# Patient Record
Sex: Female | Born: 1965 | Race: White | Hispanic: No | Smoking: Never smoker
Health system: Southern US, Community
[De-identification: ages and names within clinical notes are randomized; demographics above are authoritative.]

## PROBLEM LIST (undated history)

## (undated) DIAGNOSIS — I10 Essential (primary) hypertension: Secondary | ICD-10-CM

## (undated) DIAGNOSIS — G510 Bell's palsy: Secondary | ICD-10-CM

## (undated) HISTORY — DX: Bell's palsy: G51.0

## (undated) HISTORY — DX: Essential (primary) hypertension: I10

## (undated) HISTORY — PX: COSMETIC SURGERY: SHX468

## (undated) HISTORY — PX: NOSE SURGERY: SHX723

---

## 2013-12-11 ENCOUNTER — Emergency Department (HOSPITAL_COMMUNITY)
Admission: EM | Admit: 2013-12-11 | Discharge: 2013-12-11 | Disposition: A | Discharge status: Home / Self Care | Payer: 59 | Attending: Emergency Medicine | Admitting: Emergency Medicine

## 2013-12-11 ENCOUNTER — Emergency Department (HOSPITAL_COMMUNITY): Payer: 59

## 2013-12-11 ENCOUNTER — Encounter (HOSPITAL_COMMUNITY): Payer: Self-pay | Admitting: Emergency Medicine

## 2013-12-11 DIAGNOSIS — Y9389 Activity, other specified: Secondary | ICD-10-CM | POA: Diagnosis not present

## 2013-12-11 DIAGNOSIS — S92009A Unspecified fracture of unspecified calcaneus, initial encounter for closed fracture: Secondary | ICD-10-CM | POA: Diagnosis not present

## 2013-12-11 DIAGNOSIS — Y9289 Other specified places as the place of occurrence of the external cause: Secondary | ICD-10-CM | POA: Insufficient documentation

## 2013-12-11 DIAGNOSIS — S99929A Unspecified injury of unspecified foot, initial encounter: Secondary | ICD-10-CM

## 2013-12-11 DIAGNOSIS — S92001A Unspecified fracture of right calcaneus, initial encounter for closed fracture: Secondary | ICD-10-CM

## 2013-12-11 DIAGNOSIS — S8990XA Unspecified injury of unspecified lower leg, initial encounter: Secondary | ICD-10-CM | POA: Diagnosis present

## 2013-12-11 DIAGNOSIS — S99919A Unspecified injury of unspecified ankle, initial encounter: Secondary | ICD-10-CM

## 2013-12-11 DIAGNOSIS — W010XXA Fall on same level from slipping, tripping and stumbling without subsequent striking against object, initial encounter: Secondary | ICD-10-CM | POA: Diagnosis not present

## 2013-12-11 NOTE — ED Notes (Signed)
Pt tripped over her 40 week old puppy and was trying not to land on the dog and fell. Pt c/o pain and swelling to right ankle.

## 2013-12-11 NOTE — ED Provider Notes (Signed)
CSN: [id number]     Arrival date & time 12/11/13  1227 History  This chart was scribed for non-physician practitioner Alvina Chou, PA-C, working with Tanna Furry, MD by Zola Button, ED Scribe. This patient was seen in room [place] and the patient's care was started at 2:05 PM.    Chief Complaint  Patient presents with  . Fall  . Ankle Pain      The history is provided by the patient. No language interpreter was used.   HPI Comments: Lydia Lopez is a 48 y.o. female who presents to the Emergency Department complaining of sudden onset, constant right ankle pain with swelling following a fall that occurred PTA. Patient says that she tripped over her puppy while trying not to land on it. She says she is able to ambulate slowly. She denies any other injuries. Patient drove herself to the ED.   History reviewed. No pertinent past medical history. History reviewed. No pertinent past surgical history. No family history on file. History  Substance Use Topics  . Smoking status: Never Smoker   . Smokeless tobacco: Not on file  . Alcohol Use: Yes     Comment: social   OB History   Grav Para Term Preterm Abortions TAB SAB Ect Mult Living                 Review of Systems  Musculoskeletal: Positive for arthralgias (right ankle) and joint swelling.  All other systems reviewed and are negative.     Allergies  Review of patient's allergies indicates not on file.  Home Medications   Prior to Admission medications   Not on File   BP 212/114  Pulse 105  Temp(Src) 98.8 F (37.1 C) (Oral)  Resp 20  SpO2 99%  LMP 12/07/2013 Physical Exam  Nursing note and vitals reviewed. Constitutional: She is oriented to person, place, and time. She appears well-developed and well-nourished. No distress.  HENT:  Head: Normocephalic and atraumatic.  Mouth/Throat: Oropharynx is clear and moist. No oropharyngeal exudate.  Eyes: Pupils are equal, round, and reactive to light.  Neck: Neck  supple.  Cardiovascular: Normal rate.   Pulmonary/Chest: Effort normal.  Musculoskeletal: She exhibits no edema.  Right lateral foot TTP over the base of the 5th metatarsal, right lateral foot swelling, limited right ankle ROM due to pain, patient can wiggle toes  Neurological: She is alert and oriented to person, place, and time. No cranial nerve deficit.  Sensation intact for toes of right foot  Skin: Skin is warm and dry. No rash noted.  Psychiatric: She has a normal mood and affect. Her behavior is normal.    ED Course  Procedures  DIAGNOSTIC STUDIES: Oxygen Saturation is 99% on RA, nml by my interpretation.    COORDINATION OF CARE: 2:10 PM-Discussed treatment plan which includes pain medication, crutches, a splint and recommendation of ice and elevation for pain with pt at bedside and pt agreed to plan.   Labs Review Labs Reviewed - No data to display  Imaging Review Dg Ankle Complete Right  12/11/2013   CLINICAL DATA:  Injury.  Right ankle pain.  EXAM: RIGHT ANKLE - COMPLETE 3+ VIEW  COMPARISON:  None.  FINDINGS: A bony fragment consistent with an avulsion fracture is seen laterally likely emanating from the distal calcaneus. No other acute bony or joint abnormality is identified.  IMPRESSION: Findings most consistent with an avulsion fracture at the origin of the extensor digitorum brevis.   Electronically Signed   By: Marcello Moores  Dalessio M.D.   On: 12/11/2013 13:33   Dg Foot Complete Right  12/11/2013   CLINICAL DATA:  Tripped and fell with ankle pain  EXAM: RIGHT FOOT COMPLETE - 3+ VIEW  COMPARISON:  Ankle film from earlier in the same day  FINDINGS: There is again noted in avulsion fracture from the distal lateral aspect of the calcaneus. No other fractures are seen. No gross soft tissue abnormality is noted.  IMPRESSION: Avulsion fracture as described.   Electronically Signed   By: Inez Catalina M.D.   On: 12/11/2013 81:82    SPLINT APPLICATION Date/Time: 9:93 PM Authorized by:  Alvina Chou Consent: Verbal consent obtained. Risks and benefits: risks, benefits and alternatives were discussed Consent given by: patient Splint applied by: orthopedic technician Location details: right ankle Splint type: CAM walker Supplies used: CAM walker Post-procedure: The splinted body part was neurovascularly unchanged following the procedure. Patient tolerance: Patient tolerated the procedure well with no immediate complications.      EKG Interpretation None      MDM   Final diagnoses:  Avulsion fracture of calcaneus, right, closed, initial encounter    2:31 PM Patient's xray shows avulsion fracture of right calcaneus. Patient will have CAM walker, crutches and Orthopedic follow up. No neurovascular compromise. No other injury.   I personally performed the services described in this documentation, which was scribed in my presence. The recorded information has been reviewed and is accurate.    Alvina Chou, PA-C 12/11/13 1432

## 2013-12-11 NOTE — Discharge Instructions (Signed)
Take ibuprofen or tylenol as needed for pain. Follow up with Dr. Mayer Camel for further evaluation. Rest, ice and elevate your foot for symptom relief.

## 2013-12-13 NOTE — ED Provider Notes (Signed)
Medical screening examination/treatment/procedure(s) were performed by non-physician practitioner and as supervising physician I was immediately available for consultation/collaboration.   EKG Interpretation None        Tanna Furry, MD 12/13/13 0830

## 2015-06-22 DIAGNOSIS — E559 Vitamin D deficiency, unspecified: Secondary | ICD-10-CM | POA: Diagnosis not present

## 2015-06-22 DIAGNOSIS — R5383 Other fatigue: Secondary | ICD-10-CM | POA: Diagnosis not present

## 2015-06-22 DIAGNOSIS — R0602 Shortness of breath: Secondary | ICD-10-CM | POA: Diagnosis not present

## 2015-06-22 DIAGNOSIS — Z0000 Encounter for general adult medical examination without abnormal findings: Secondary | ICD-10-CM | POA: Diagnosis not present

## 2015-06-22 DIAGNOSIS — Z1389 Encounter for screening for other disorder: Secondary | ICD-10-CM | POA: Diagnosis not present

## 2015-07-06 DIAGNOSIS — E559 Vitamin D deficiency, unspecified: Secondary | ICD-10-CM | POA: Diagnosis not present

## 2015-07-06 DIAGNOSIS — E7800 Pure hypercholesterolemia, unspecified: Secondary | ICD-10-CM | POA: Diagnosis not present

## 2015-07-15 DIAGNOSIS — N183 Chronic kidney disease, stage 3 (moderate): Secondary | ICD-10-CM | POA: Diagnosis not present

## 2015-08-19 ENCOUNTER — Ambulatory Visit: Payer: 59 | Admitting: Family Medicine

## 2015-08-22 IMAGING — CR DG FOOT COMPLETE 3+V*R*
3 series · 3 of 3 positions shown · non-contrast
Comparison: Ankle film from earlier in the same day

CLINICAL DATA: Tripped and fell with ankle pain

EXAM:
RIGHT FOOT COMPLETE - 3+ VIEW

[x foot lat right]
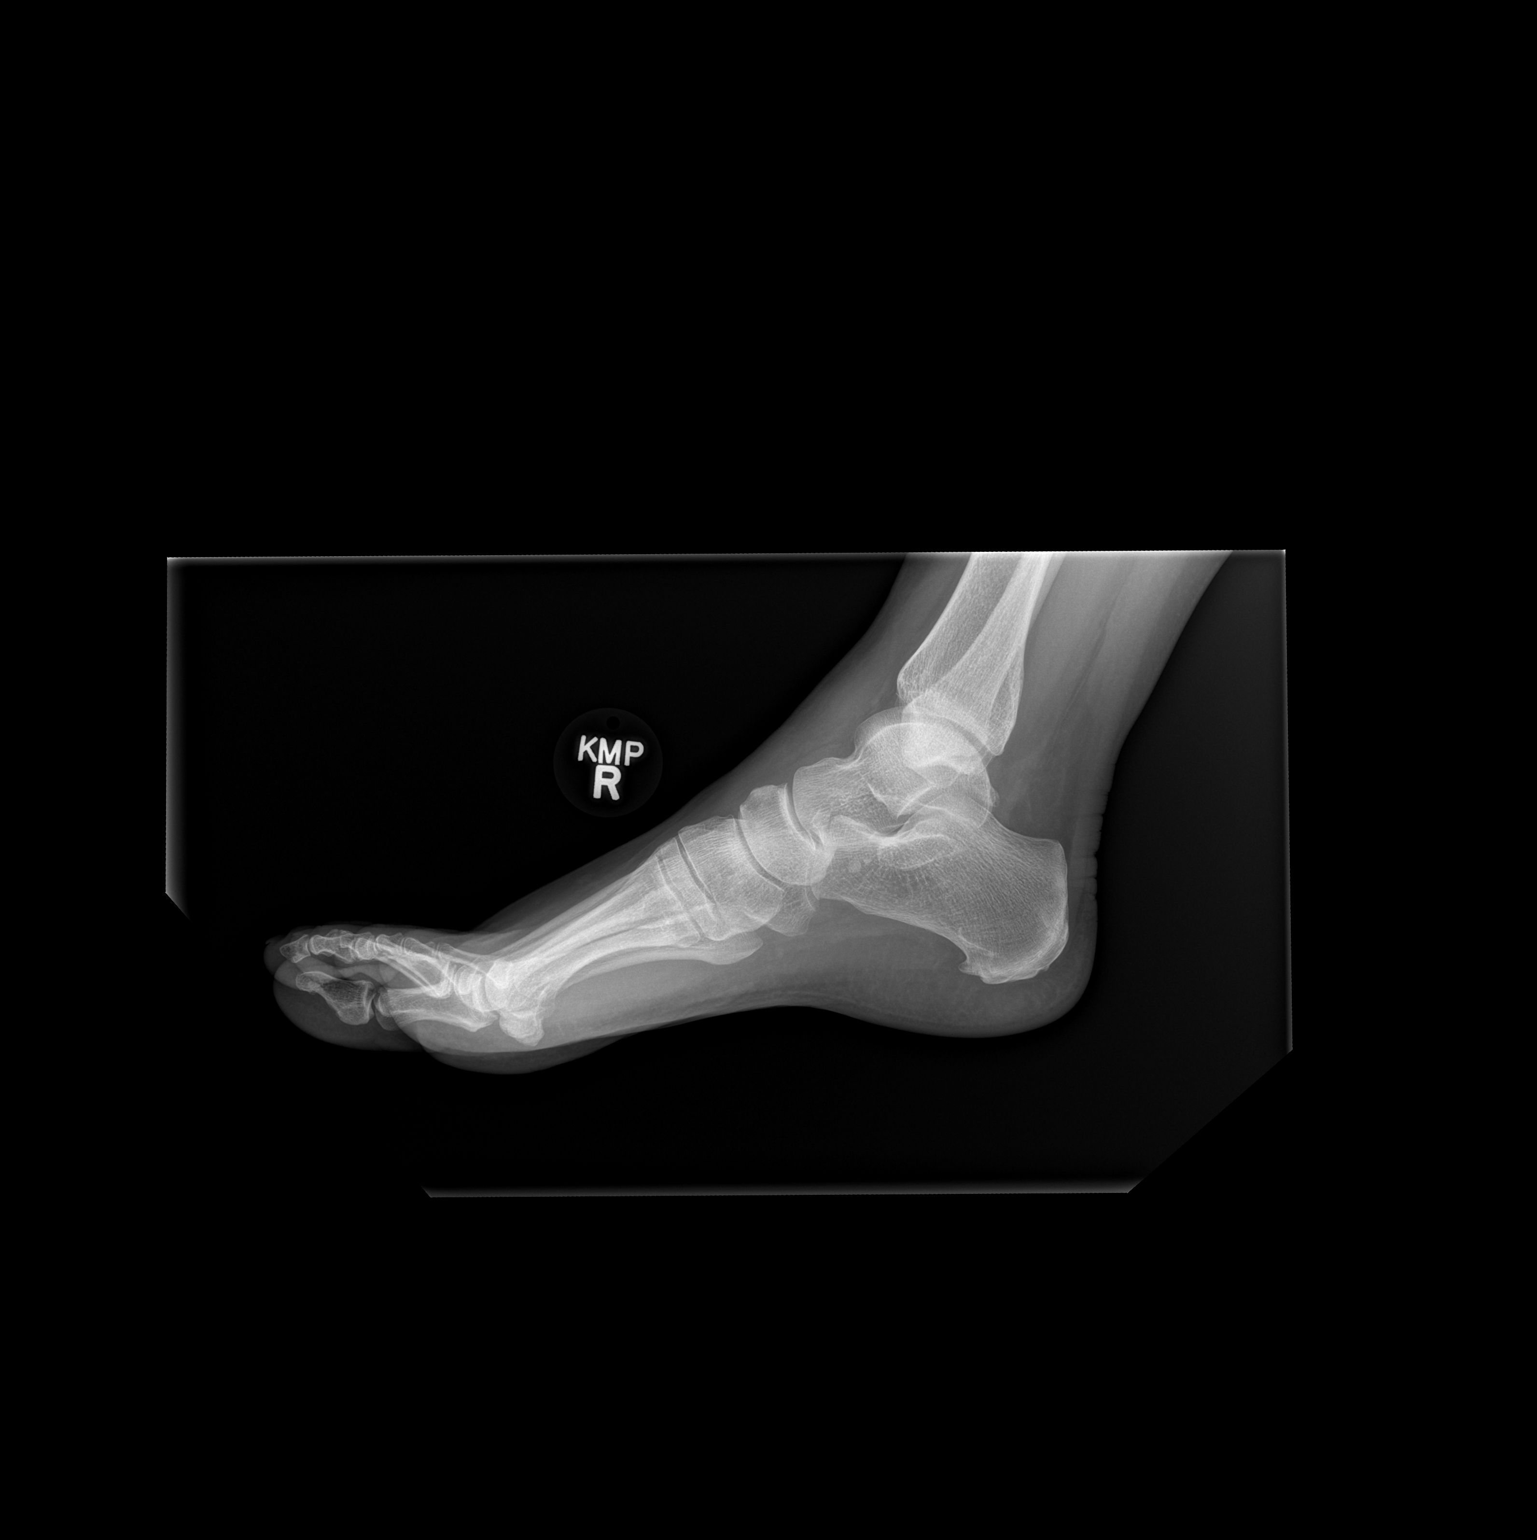

[x foot ap right]
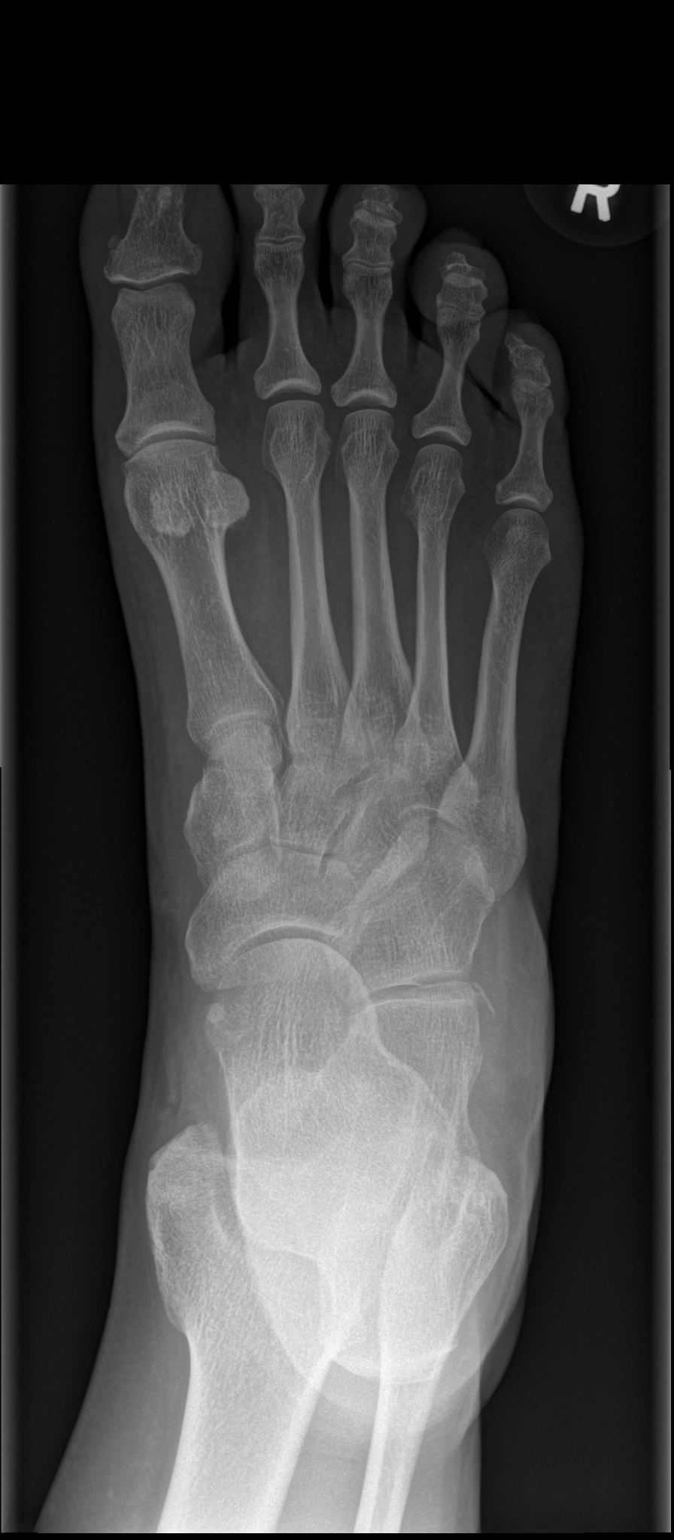

[x foot obl right]
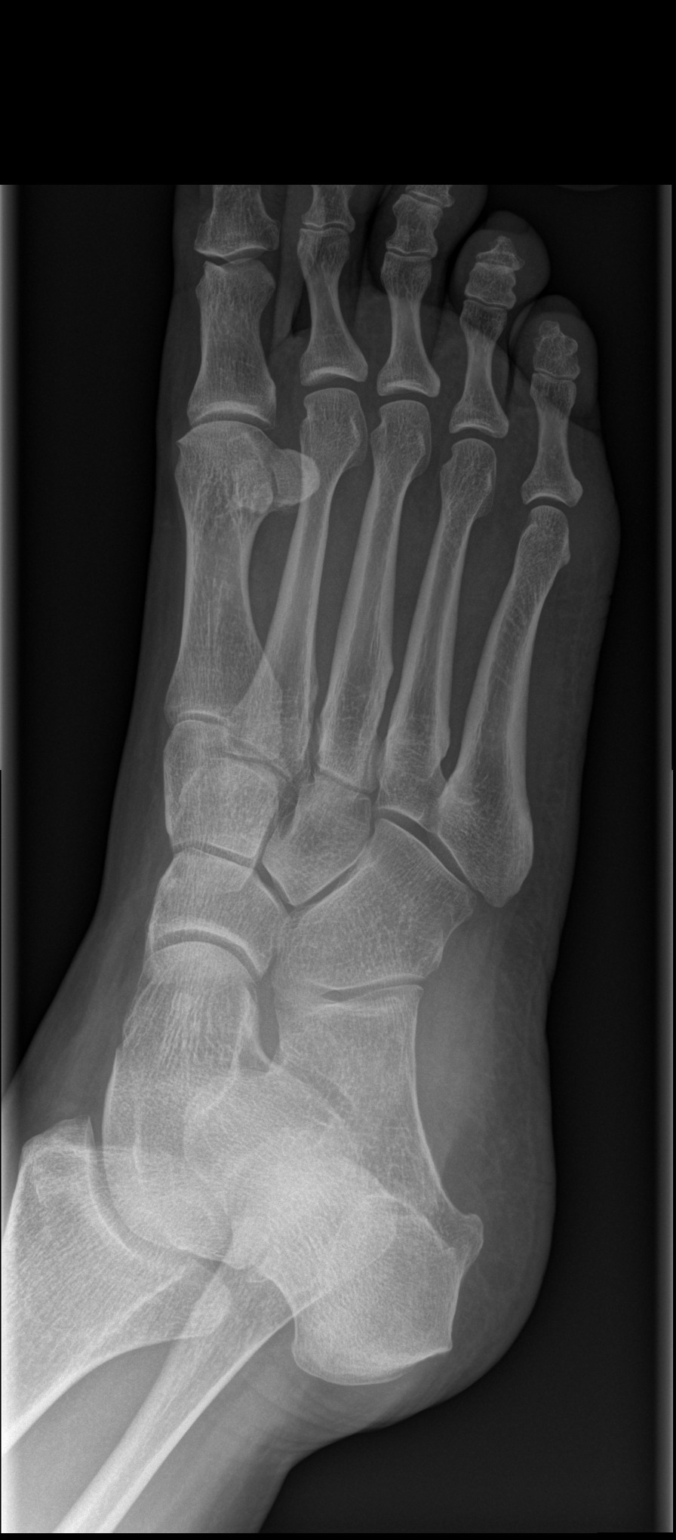

[3 of 3 positions shown; findings below may reference images not displayed]

FINDINGS: There is again noted in avulsion fracture from the distal lateral
aspect of the calcaneus. No other fractures are seen. No gross soft
tissue abnormality is noted.
IMPRESSION: Avulsion fracture as described.

## 2016-05-10 DIAGNOSIS — R0602 Shortness of breath: Secondary | ICD-10-CM | POA: Diagnosis not present

## 2016-05-10 DIAGNOSIS — J069 Acute upper respiratory infection, unspecified: Secondary | ICD-10-CM | POA: Diagnosis not present

## 2016-05-10 DIAGNOSIS — J209 Acute bronchitis, unspecified: Secondary | ICD-10-CM | POA: Diagnosis not present

## 2016-05-10 DIAGNOSIS — E559 Vitamin D deficiency, unspecified: Secondary | ICD-10-CM | POA: Diagnosis not present

## 2016-09-26 DIAGNOSIS — R5383 Other fatigue: Secondary | ICD-10-CM | POA: Diagnosis not present

## 2016-09-26 DIAGNOSIS — Z0000 Encounter for general adult medical examination without abnormal findings: Secondary | ICD-10-CM | POA: Diagnosis not present

## 2016-09-26 DIAGNOSIS — E559 Vitamin D deficiency, unspecified: Secondary | ICD-10-CM | POA: Diagnosis not present

## 2016-09-26 DIAGNOSIS — Z114 Encounter for screening for human immunodeficiency virus [HIV]: Secondary | ICD-10-CM | POA: Diagnosis not present

## 2018-03-24 DIAGNOSIS — L219 Seborrheic dermatitis, unspecified: Secondary | ICD-10-CM | POA: Diagnosis not present

## 2018-03-24 DIAGNOSIS — R05 Cough: Secondary | ICD-10-CM | POA: Diagnosis not present

## 2018-03-24 DIAGNOSIS — J209 Acute bronchitis, unspecified: Secondary | ICD-10-CM | POA: Diagnosis not present

## 2018-04-25 DIAGNOSIS — J209 Acute bronchitis, unspecified: Secondary | ICD-10-CM | POA: Diagnosis not present

## 2018-04-25 DIAGNOSIS — Z114 Encounter for screening for human immunodeficiency virus [HIV]: Secondary | ICD-10-CM | POA: Diagnosis not present

## 2018-04-25 DIAGNOSIS — Z0000 Encounter for general adult medical examination without abnormal findings: Secondary | ICD-10-CM | POA: Diagnosis not present

## 2018-04-25 DIAGNOSIS — R05 Cough: Secondary | ICD-10-CM | POA: Diagnosis not present

## 2018-09-24 DIAGNOSIS — N61 Mastitis without abscess: Secondary | ICD-10-CM | POA: Diagnosis not present

## 2018-10-08 DIAGNOSIS — R05 Cough: Secondary | ICD-10-CM | POA: Diagnosis not present

## 2018-10-08 DIAGNOSIS — N61 Mastitis without abscess: Secondary | ICD-10-CM | POA: Diagnosis not present

## 2018-10-08 DIAGNOSIS — J209 Acute bronchitis, unspecified: Secondary | ICD-10-CM | POA: Diagnosis not present

## 2018-10-08 DIAGNOSIS — Z79899 Other long term (current) drug therapy: Secondary | ICD-10-CM | POA: Diagnosis not present

## 2018-10-08 DIAGNOSIS — E7800 Pure hypercholesterolemia, unspecified: Secondary | ICD-10-CM | POA: Diagnosis not present

## 2018-10-08 DIAGNOSIS — Z1159 Encounter for screening for other viral diseases: Secondary | ICD-10-CM | POA: Diagnosis not present

## 2021-04-17 ENCOUNTER — Ambulatory Visit
Admission: EM | Admit: 2021-04-17 | Discharge: 2021-04-17 | Disposition: A | Discharge status: Home / Self Care | Payer: No Typology Code available for payment source | Attending: Internal Medicine | Admitting: Internal Medicine

## 2021-04-17 ENCOUNTER — Other Ambulatory Visit: Payer: Self-pay

## 2021-04-17 DIAGNOSIS — H65191 Other acute nonsuppurative otitis media, right ear: Secondary | ICD-10-CM

## 2021-04-17 DIAGNOSIS — J069 Acute upper respiratory infection, unspecified: Secondary | ICD-10-CM | POA: Diagnosis not present

## 2021-04-17 MED ORDER — DOXYCYCLINE HYCLATE 100 MG PO CAPS: 100.0000 mg | ORAL_CAPSULE | Freq: Two times a day (BID) | ORAL | 0 refills | Status: DC

## 2021-04-17 NOTE — Discharge Instructions (Signed)
You are being treated with antibiotics for ear infection and upper respiratory infection.

## 2021-04-17 NOTE — ED Provider Notes (Signed)
[facility]    CSN: [id number] Arrival date & time: 04/17/21  0804      History   Chief Complaint Chief Complaint  Patient presents with   Facial Pain    HPI Lydia Lopez is a 56 y.o. female.   Patient presents with sinus pressure, nasal congestion, nasal drainage, bilateral ear discomfort that started approximately 3 days ago.  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  She has never seen an ENT specialist.  Patient denies any cough, chest pain, shortness of breath, sore throat, nausea, vomiting, diarrhea, abdominal pain.  Denies any known fevers or sick contacts but does she does work at the hospital.  Patient has taken [place] with minimal improvement in symptoms.  Patient reports that she was recently treated for sinus infection at the beginning of January with cephalexin.    History reviewed. No pertinent past medical history.  There are no problems to display for this patient.   History reviewed. No pertinent surgical history.  OB History   No obstetric history on file.      Home Medications    Prior to Admission medications   Medication Sig Start Date End Date Taking? Authorizing Provider  doxycycline (VIBRAMYCIN) 100 MG capsule Take 1 capsule (100 mg total) by mouth 2 (two) times daily. 04/17/21  Yes Teodora Medici, FNP    Family History History reviewed. No pertinent family history.  Social History Social History   Tobacco Use   Smoking status: Never  Substance Use Topics   Alcohol use: Yes    Comment: social     Allergies   Penicillins   Review of Systems Review of Systems Per HPI  Physical Exam Triage Vital Signs ED Triage Vitals [04/17/21 0831]  Enc Vitals Group     BP (!) 158/100     Pulse Rate 94     Resp 18     Temp 97.7 F (36.5 C)     Temp Source Oral     SpO2 98 %     Weight      Height      Head Circumference      Peak Flow      Pain  Score 0     Pain Loc      Pain Edu?      Excl. in [place]?    No data found.  Updated Vital Signs BP (!) 158/100 (BP Location: Left Arm)    Pulse 94    Temp 97.7 F (36.5 C) (Oral)    Resp 18    SpO2 98%   Visual Acuity Right Eye Distance:   Left Eye Distance:   Bilateral Distance:    Right Eye Near:   Left Eye Near:    Bilateral Near:     Physical Exam Constitutional:      General: She is not in acute distress.    Appearance: Normal appearance. She is not toxic-appearing or diaphoretic.  HENT:     Head: Normocephalic and atraumatic.     Right Ear: Ear canal normal. No swelling or tenderness. No middle ear effusion. No mastoid tenderness. Tympanic membrane is erythematous. Tympanic membrane is not perforated or bulging.     Left Ear: Tympanic membrane and ear canal normal. No swelling or tenderness.  No middle ear effusion. No mastoid tenderness. Tympanic membrane is not perforated, erythematous or bulging.     Nose: Congestion present.  Mouth/Throat:     Mouth: Mucous membranes are moist.     Pharynx: No posterior oropharyngeal erythema.  Eyes:     Extraocular Movements: Extraocular movements intact.     Conjunctiva/sclera: Conjunctivae normal.     Pupils: Pupils are equal, round, and reactive to light.  Cardiovascular:     Rate and Rhythm: Normal rate and regular rhythm.     Pulses: Normal pulses.     Heart sounds: Normal heart sounds.  Pulmonary:     Effort: Pulmonary effort is normal. No respiratory distress.     Breath sounds: Normal breath sounds. No stridor. No wheezing, rhonchi or rales.  Abdominal:     General: Abdomen is flat. Bowel sounds are normal.     Palpations: Abdomen is soft.  Musculoskeletal:        General: Normal range of motion.     Cervical back: Normal range of motion.  Skin:    General: Skin is warm and dry.  Neurological:     General: No focal deficit present.     Mental Status: She is alert and oriented to person, place, and time. Mental  status is at baseline.  Psychiatric:        Mood and Affect: Mood normal.        Behavior: Behavior normal.     UC Treatments / Results  Labs (all labs ordered are listed, but only abnormal results are displayed) Labs Reviewed  NOVEL CORONAVIRUS, NAA    EKG   Radiology No results found.  Procedures Procedures (including critical care time)  Medications Ordered in UC Medications - No data to display  Initial Impression / Assessment and Plan / UC Course  I have reviewed the triage vital signs and the nursing notes.  Pertinent labs & imaging results that were available during my care of the patient were reviewed by me and considered in my medical decision making (see chart for details).     Patient's symptoms may be viral in etiology but she does have right otitis media.  It is possible that she could be developing a sinus affection given history of recurrent sinus infections.  Will opt to treat with doxycycline antibiotic given patient's current allergy list, patient recently being treated with cephalexin antibiotic, an ear infection that is present.  COVID-19 PCR pending.  Discussed symptomatic treatment with patient.  Patient verbalized understanding was agreeable with plan. Final Clinical Impressions(s) / UC Diagnoses   Final diagnoses:  Other non-recurrent acute nonsuppurative otitis media of right ear  Acute upper respiratory infection     Discharge Instructions      You are being treated with antibiotics for ear infection and upper respiratory infection.    ED Prescriptions     Medication Sig Dispense Auth. Provider   doxycycline (VIBRAMYCIN) 100 MG capsule Take 1 capsule (100 mg total) by mouth 2 (two) times daily. 20 capsule Teodora Medici, [place]      PDMP not reviewed this encounter.   Teodora Medici, [place] 04/17/21 [phone]

## 2021-04-17 NOTE — ED Triage Notes (Signed)
Pt c/o "sinus infection" with "stopped up ears" bilat and post nasal drainage. 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

## 2021-04-18 ENCOUNTER — Ambulatory Visit: Payer: Self-pay

## 2021-04-18 LAB — NOVEL CORONAVIRUS, NAA: SARS-CoV-2, NAA: NOT DETECTED

## 2021-04-18 LAB — SARS-COV-2, NAA 2 DAY TAT

## 2021-05-02 ENCOUNTER — Ambulatory Visit: Payer: No Typology Code available for payment source

## 2021-05-04 ENCOUNTER — Ambulatory Visit: Payer: No Typology Code available for payment source

## 2021-05-05 ENCOUNTER — Other Ambulatory Visit: Payer: Self-pay

## 2021-05-05 ENCOUNTER — Ambulatory Visit
Admission: RE | Admit: 2021-05-05 | Discharge: 2021-05-05 | Disposition: A | Discharge status: Home / Self Care | Payer: No Typology Code available for payment source | Source: Ambulatory Visit | Attending: Internal Medicine | Admitting: Internal Medicine

## 2021-05-05 VITALS — BP 155/90 | HR 90 | Temp 97.7°F | Resp 16

## 2021-05-05 DIAGNOSIS — H6123 Impacted cerumen, bilateral: Secondary | ICD-10-CM | POA: Diagnosis not present

## 2021-05-05 NOTE — Discharge Instructions (Addendum)
Your ears have been washed out.  Please follow-up if symptoms persist or worsen.

## 2021-05-05 NOTE — ED Triage Notes (Signed)
Bilateral cerumen impaction x 3 weeks

## 2021-05-05 NOTE — ED Provider Notes (Signed)
[facility]    CSN: [id number] Arrival date & time: 05/05/21  1525      History   Chief Complaint Chief Complaint  Patient presents with   Cerumen Impaction    HPI Lydia Lopez is a 56 y.o. female.   Patient presents for bilateral ear discomfort and feelings of ear fullness.  Symptoms have been present for approximately 3 weeks.  She was seen approximately 2 weeks ago for ear infection and was treated with antibiotics.  She denies any pain except for at night when she lies flat.  Denies associated upper respiratory symptoms or fever.  Denies trauma, foreign body, drainage from the ear, decreased hearing.    History reviewed. No pertinent past medical history.  There are no problems to display for this patient.   History reviewed. No pertinent surgical history.  OB History   No obstetric history on file.      Home Medications    Prior to Admission medications   Medication Sig Start Date End Date Taking? Authorizing Provider  doxycycline (VIBRAMYCIN) 100 MG capsule Take 1 capsule (100 mg total) by mouth 2 (two) times daily. 04/17/21   Teodora Medici, FNP    Family History History reviewed. No pertinent family history.  Social History Social History   Tobacco Use   Smoking status: Never  Substance Use Topics   Alcohol use: Yes    Comment: social     Allergies   Penicillins   Review of Systems Review of Systems Per HPI  Physical Exam Triage Vital Signs ED Triage Vitals [05/05/21 1548]  Enc Vitals Group     BP (!) 155/90     Pulse Rate 90     Resp 16     Temp 97.7 F (36.5 C)     Temp Source Oral     SpO2 97 %     Weight      Height      Head Circumference      Peak Flow      Pain Score 4     Pain Loc      Pain Edu?      Excl. in [place]?    No data found.  Updated Vital Signs BP (!) 155/90 (BP Location: Left Arm)    Pulse 90    Temp 97.7 F (36.5 C) (Oral)    Resp 16    SpO2 97%   Visual Acuity Right Eye Distance:    Left Eye Distance:   Bilateral Distance:    Right Eye Near:   Left Eye Near:    Bilateral Near:     Physical Exam Constitutional:      General: She is not in acute distress.    Appearance: Normal appearance. She is not toxic-appearing or diaphoretic.  HENT:     Head: Normocephalic and atraumatic.     Right Ear: Ear canal and external ear normal. No drainage, swelling or tenderness. No middle ear effusion. There is impacted cerumen. Tympanic membrane is not perforated, erythematous or bulging.     Left Ear: Ear canal and external ear normal. No drainage, swelling or tenderness.  No middle ear effusion. There is impacted cerumen. Tympanic membrane is not perforated, erythematous or bulging.     Ears:     Comments: Bilateral cerumen impaction on original exam.  Ears were irrigated bilaterally with success removal of the cerumen.  No abnormalities on second physical exam. Eyes:     Extraocular Movements: Extraocular movements intact.  Conjunctiva/sclera: Conjunctivae normal.  Pulmonary:     Effort: Pulmonary effort is normal.  Neurological:     General: No focal deficit present.     Mental Status: She is alert and oriented to person, place, and time. Mental status is at baseline.  Psychiatric:        Mood and Affect: Mood normal.        Behavior: Behavior normal.        Thought Content: Thought content normal.        Judgment: Judgment normal.     UC Treatments / Results  Labs (all labs ordered are listed, but only abnormal results are displayed) Labs Reviewed - No data to display  EKG   Radiology No results found.  Procedures Procedures (including critical care time)  Medications Ordered in UC Medications - No data to display  Initial Impression / Assessment and Plan / UC Course  I have reviewed the triage vital signs and the nursing notes.  Pertinent labs & imaging results that were available during my care of the patient were reviewed by me and considered in  my medical decision making (see chart for details).     Ears were irrigated bilaterally due to bilaterally impacted cerumen.  Successful removal of cerumen was completed.  No abnormalities on second physical exam.  Discussed return precautions.  Patient verbalized understanding and was agreeable with plan. Final Clinical Impressions(s) / UC Diagnoses   Final diagnoses:  Bilateral impacted cerumen     Discharge Instructions      Your ears have been washed out.  Please follow-up if symptoms persist or worsen.     ED Prescriptions   None    PDMP not reviewed this encounter.   Teodora Medici, [place] 05/05/21 1616

## 2021-10-24 ENCOUNTER — Ambulatory Visit: Payer: No Typology Code available for payment source | Admitting: Internal Medicine

## 2021-11-07 ENCOUNTER — Ambulatory Visit: Payer: No Typology Code available for payment source | Admitting: Internal Medicine

## 2021-12-14 ENCOUNTER — Ambulatory Visit: Payer: No Typology Code available for payment source | Admitting: Family Medicine

## 2022-01-25 ENCOUNTER — Ambulatory Visit: Payer: No Typology Code available for payment source | Admitting: Family Medicine

## 2022-03-02 ENCOUNTER — Ambulatory Visit: Payer: No Typology Code available for payment source | Admitting: Internal Medicine

## 2022-03-03 ENCOUNTER — Encounter (HOSPITAL_COMMUNITY): Payer: Self-pay | Admitting: Emergency Medicine

## 2022-03-03 ENCOUNTER — Emergency Department (HOSPITAL_COMMUNITY)
Admission: EM | Admit: 2022-03-03 | Discharge: 2022-03-03 | Disposition: A | Discharge status: Home / Self Care | Payer: No Typology Code available for payment source | Attending: Emergency Medicine | Admitting: Emergency Medicine

## 2022-03-03 ENCOUNTER — Emergency Department (HOSPITAL_COMMUNITY): Payer: No Typology Code available for payment source

## 2022-03-03 ENCOUNTER — Ambulatory Visit: Payer: Self-pay

## 2022-03-03 ENCOUNTER — Other Ambulatory Visit: Payer: Self-pay

## 2022-03-03 ENCOUNTER — Ambulatory Visit: Payer: No Typology Code available for payment source

## 2022-03-03 DIAGNOSIS — I10 Essential (primary) hypertension: Secondary | ICD-10-CM | POA: Diagnosis not present

## 2022-03-03 DIAGNOSIS — G510 Bell's palsy: Secondary | ICD-10-CM | POA: Insufficient documentation

## 2022-03-03 DIAGNOSIS — R29810 Facial weakness: Secondary | ICD-10-CM | POA: Diagnosis present

## 2022-03-03 LAB — CBC
HCT: 42.3 % (ref 36.0–46.0)
Hemoglobin: 13.7 g/dL (ref 12.0–15.0)
MCH: 28.7 pg (ref 26.0–34.0)
MCHC: 32.4 g/dL (ref 30.0–36.0)
MCV: 88.5 fL (ref 80.0–100.0)
Platelets: 338 10*3/uL (ref 150–400)
RBC: 4.78 MIL/uL (ref 3.87–5.11)
RDW: 13.1 % (ref 11.5–15.5)
WBC: 8 10*3/uL (ref 4.0–10.5)
nRBC: 0 % (ref 0.0–0.2)

## 2022-03-03 LAB — COMPREHENSIVE METABOLIC PANEL
ALT: 33 U/L (ref 0–44)
AST: 31 U/L (ref 15–41)
Albumin: 4.1 g/dL (ref 3.5–5.0)
Alkaline Phosphatase: 129 U/L — ABNORMAL HIGH (ref 38–126)
Anion gap: 8 (ref 5–15)
BUN: 26 mg/dL — ABNORMAL HIGH (ref 6–20)
CO2: 22 mmol/L (ref 22–32)
Calcium: 9.4 mg/dL (ref 8.9–10.3)
Chloride: 106 mmol/L (ref 98–111)
Creatinine, Ser: 0.8 mg/dL (ref 0.44–1.00)
GFR, Estimated: >60 mL/min (ref >60)
Glucose, Bld: 113 mg/dL — ABNORMAL HIGH (ref 70–99)
Potassium: 4.2 mmol/L (ref 3.5–5.1)
Sodium: 136 mmol/L (ref 135–145)
Total Bilirubin: 0.3 mg/dL (ref 0.3–1.2)
Total Protein: 8.5 g/dL — ABNORMAL HIGH (ref 6.5–8.1)

## 2022-03-03 LAB — URINALYSIS, ROUTINE W REFLEX MICROSCOPIC
Bilirubin Urine: NEGATIVE
Glucose, UA: NEGATIVE mg/dL
Hgb urine dipstick: NEGATIVE
Ketones, ur: NEGATIVE mg/dL
Leukocytes,Ua: NEGATIVE
Nitrite: NEGATIVE
Protein, ur: NEGATIVE mg/dL
Specific Gravity, Urine: 1.008 (ref 1.005–1.030)
pH: 5 (ref 5.0–8.0)

## 2022-03-03 LAB — DIFFERENTIAL
Abs Immature Granulocytes: 0.01 10*3/uL (ref 0.00–0.07)
Basophils Absolute: 0 10*3/uL (ref 0.0–0.1)
Basophils Relative: 1 %
Eosinophils Absolute: 0.3 10*3/uL (ref 0.0–0.5)
Eosinophils Relative: 3 %
Immature Granulocytes: 0 %
Lymphocytes Relative: 28 %
Lymphs Abs: 2.2 10*3/uL (ref 0.7–4.0)
Monocytes Absolute: 0.8 10*3/uL (ref 0.1–1.0)
Monocytes Relative: 11 %
Neutro Abs: 4.6 10*3/uL (ref 1.7–7.7)
Neutrophils Relative %: 57 %

## 2022-03-03 LAB — RAPID URINE DRUG SCREEN, HOSP PERFORMED
Amphetamines: NOT DETECTED
Barbiturates: NOT DETECTED
Benzodiazepines: NOT DETECTED
Cocaine: NOT DETECTED
Opiates: NOT DETECTED
Tetrahydrocannabinol: NOT DETECTED

## 2022-03-03 LAB — I-STAT CHEM 8, ED
BUN: 24 mg/dL — ABNORMAL HIGH (ref 6–20)
Calcium, Ion: 1.25 mmol/L (ref 1.15–1.40)
Chloride: 107 mmol/L (ref 98–111)
Creatinine, Ser: 0.7 mg/dL (ref 0.44–1.00)
Glucose, Bld: 120 mg/dL — ABNORMAL HIGH (ref 70–99)
HCT: 42 % (ref 36.0–46.0)
Hemoglobin: 14.3 g/dL (ref 12.0–15.0)
Potassium: 4.4 mmol/L (ref 3.5–5.1)
Sodium: 139 mmol/L (ref 135–145)
TCO2: 23 mmol/L (ref 22–32)

## 2022-03-03 LAB — ETHANOL: Alcohol, Ethyl (B): <10 mg/dL (ref <10)

## 2022-03-03 LAB — APTT: aPTT: 27 seconds (ref 24–36)

## 2022-03-03 LAB — PROTIME-INR
INR: 1.1 (ref 0.8–1.2)
Prothrombin Time: 13.9 seconds (ref 11.4–15.2)

## 2022-03-03 MED ORDER — VALACYCLOVIR HCL 1 G PO TABS: 1000.0000 mg | ORAL_TABLET | Freq: Three times a day (TID) | ORAL | 0 refills | Status: DC

## 2022-03-03 MED ORDER — PREDNISONE 20 MG PO TABS
60.0000 mg | ORAL_TABLET | Freq: Once | ORAL | Status: AC
  Administered 2022-03-03: 60 mg via ORAL
  Filled 2022-03-03: qty 3

## 2022-03-03 MED ORDER — PREDNISONE 20 MG PO TABS: ORAL_TABLET | ORAL | 0 refills | Status: DC

## 2022-03-03 NOTE — ED Provider Triage Note (Addendum)
Emergency Medicine Provider Triage Evaluation Note  Lydia Lopez , a 56 y.o. female  was evaluated in triage.  Pt complains of left sided facial droop and numbness/tingling. Last known well was around noon yesterday. She doesn't have any extremity weakness. She reports she has had Bells Palsy before and this felt similar.  Review of Systems  Positive:  Negative:   Physical Exam  There were no vitals taken for this visit. Gen:   Awake, no distress   Resp:  Normal effort  MSK:   Moves extremities without difficulty  Other:  Left sided facial droop. Some forehead involvement. Strength equal in upper and lower extremitites.   Medical Decision Making  Medically screening exam initiated at 2:45 PM.  Appropriate orders placed.  NINAMARIE KEEL was informed that the remainder of the evaluation will be completed by another provider, this initial triage assessment does not replace that evaluation, and the importance of remaining in the ED until their evaluation is complete.  Given LKW was over 24 hours ago, no code stroke called. CT head and labs ordered.    Sherrell Puller, PA-C 03/03/22 1448    Sherrell Puller, PA-C 03/03/22 1448

## 2022-03-03 NOTE — ED Provider Notes (Signed)
[facility] Provider Note   CSN: [id number] Arrival date & time: 03/03/22  1430     History  Chief Complaint  Patient presents with   Facial Droop    Lydia Lopez is a 56 y.o. female history of hypertension, here presenting with left facial droop.  Patient states that earlier in the week, she had a cold sore.  She woke up today and noticed left facial droop.  Patient states that this is similar to her previous Bell's palsy when she was 56 years old.  Denies any trouble speaking.  The history is provided by the patient.       Home Medications Prior to Admission medications   Medication Sig Start Date End Date Taking? Authorizing Provider  doxycycline (VIBRAMYCIN) 100 MG capsule Take 1 capsule (100 mg total) by mouth 2 (two) times daily. 04/17/21   Teodora Medici, FNP      Allergies    Penicillins    Review of Systems   Review of Systems  Neurological:        Left facial droop  All other systems reviewed and are negative.   Physical Exam Updated Vital Signs BP (!) 185/108 (BP Location: Left Arm)   Pulse 100   Temp 97.8 F (36.6 C) (Oral)   Resp 16   SpO2 96%  Physical Exam Vitals and nursing note reviewed.  Constitutional:      Appearance: Normal appearance.  HENT:     Head: Normocephalic.     Nose: Nose normal.     Mouth/Throat:     Mouth: Mucous membranes are moist.  Eyes:     Extraocular Movements: Extraocular movements intact.     Pupils: Pupils are equal, round, and reactive to light.  Cardiovascular:     Rate and Rhythm: Normal rate and regular rhythm.     Pulses: Normal pulses.     Heart sounds: Normal heart sounds.  Pulmonary:     Effort: Pulmonary effort is normal.     Breath sounds: Normal breath sounds.  Abdominal:     General: Abdomen is flat.     Palpations: Abdomen is soft.  Musculoskeletal:        General: Normal range of motion.     Cervical back: Normal range of motion and neck supple.   Skin:    General: Skin is warm.  Neurological:     Mental Status: She is alert.     Comments: Patient has obvious left facial droop.  Also unable to fully close the left eye as well.  Patient has no obvious rash on the face.  No obvious slurred speech.  Patient has normal strength and sensation bilateral arms and legs.  Psychiatric:        Mood and Affect: Mood normal.        Behavior: Behavior normal.     ED Results / Procedures / Treatments   Labs (all labs ordered are listed, but only abnormal results are displayed) Labs Reviewed  COMPREHENSIVE METABOLIC PANEL - Abnormal; Notable for the following components:      Result Value   Glucose, Bld 113 (*)    BUN 26 (*)    Total Protein 8.5 (*)    Alkaline Phosphatase 129 (*)    All other components within normal limits  URINALYSIS, ROUTINE W REFLEX MICROSCOPIC - Abnormal; Notable for the following components:   Color, Urine STRAW (*)    All other components within normal limits  I-STAT CHEM 8,  ED - Abnormal; Notable for the following components:   BUN 24 (*)    Glucose, Bld 120 (*)    All other components within normal limits  ETHANOL  PROTIME-INR  APTT  CBC  DIFFERENTIAL  RAPID URINE DRUG SCREEN, HOSP PERFORMED  I-STAT BETA HCG BLOOD, ED ([facility], [facility], [facility] ONLY)    EKG None  Radiology CT HEAD WO CONTRAST  Result Date: 03/03/2022 CLINICAL DATA:  Facial paralysis and weakness.  Facial droop. EXAM: CT HEAD WITHOUT CONTRAST TECHNIQUE: Contiguous axial images were obtained from the base of the skull through the vertex without intravenous contrast. RADIATION DOSE REDUCTION: This exam was performed according to the departmental dose-optimization program which includes automated exposure control, adjustment of the mA and/or kV according to patient size and/or use of iterative reconstruction technique. COMPARISON:  None Available. FINDINGS: Brain: The brainstem, cerebellum, cerebral peduncles, thalami, basal ganglia, basilar cisterns,  and ventricular system appear within normal limits. Vascular: Atherosclerotic calcifications of the vertebral arteries. Skull: Unremarkable Sinuses/Orbits: Unremarkable Other: No supplemental non-categorized findings. IMPRESSION: 1. No acute intracranial findings. 2. Atherosclerosis. Electronically Signed   By: Van Clines M.D.   On: 03/03/2022 15:47    Procedures Procedures    Medications Ordered in ED Medications  predniSONE (DELTASONE) tablet 60 mg (has no administration in time range)    ED Course/ Medical Decision Making/ A&P                           Medical Decision Making Lydia Lopez is a 56 y.o. female here presenting with left facial droop.  Patient has obvious Bell's palsy.  I reviewed labs and CT scan done in triage today and they were unremarkable.  In particular there is no mass on the CT head.  Patient will be discharged home with a course of Valtrex and steroids.  I told her to use artificial tears for the eye   Problems Addressed: Bell's palsy: acute illness or injury  Amount and/or Complexity of Data Reviewed Labs: ordered. Decision-making details documented in ED Course. Radiology: ordered and independent interpretation performed. Decision-making details documented in ED Course.  Risk Prescription drug management.    Final Clinical Impression(s) / ED Diagnoses Final diagnoses:  None    Rx / DC Orders ED Discharge Orders     None         Drenda Freeze, MD 03/03/22 2126

## 2022-03-03 NOTE — ED Triage Notes (Signed)
Pt reports facial droop that she noticed this morning upon waking. Pt reports she was last normal at 11pm last night.

## 2022-03-03 NOTE — Discharge Instructions (Signed)
Please take prednisone as prescribed  Take Valtrex as prescribed as well.  As I mentioned, your left eye may be dry so you should use artificial tears during the day and eye patch at night  See your doctor for follow-up  Return to ER if you have worse weakness or trouble speaking

## 2022-03-22 ENCOUNTER — Encounter: Payer: Self-pay | Admitting: Family Medicine

## 2022-03-22 ENCOUNTER — Ambulatory Visit: Payer: Commercial Managed Care - PPO | Admitting: Family Medicine

## 2022-03-22 VITALS — BP 132/88 | HR 95 | Temp 97.6°F | Ht 64.0 in | Wt 186.0 lb

## 2022-03-22 DIAGNOSIS — Z7689 Persons encountering health services in other specified circumstances: Secondary | ICD-10-CM

## 2022-03-22 DIAGNOSIS — Z136 Encounter for screening for cardiovascular disorders: Secondary | ICD-10-CM

## 2022-03-22 DIAGNOSIS — G510 Bell's palsy: Secondary | ICD-10-CM | POA: Diagnosis not present

## 2022-03-22 DIAGNOSIS — R030 Elevated blood-pressure reading, without diagnosis of hypertension: Secondary | ICD-10-CM

## 2022-03-22 DIAGNOSIS — I7090 Unspecified atherosclerosis: Secondary | ICD-10-CM | POA: Diagnosis not present

## 2022-03-22 NOTE — Patient Instructions (Addendum)
You will hear from [facility] to schedule your CT coronary calcium screening test.   DASH Eating Plan DASH stands for Dietary Approaches to Stop Hypertension. The DASH eating plan is a healthy eating plan that has been shown to: Reduce high blood pressure (hypertension). Reduce your risk for type 2 diabetes, heart disease, and stroke. Help with weight loss. What are tips for following this plan? Reading food labels Check food labels for the amount of salt (sodium) per serving. Choose foods with less than 5 percent of the Daily Value of sodium. Generally, foods with less than 300 milligrams (mg) of sodium per serving fit into this eating plan. To find whole grains, look for the word "whole" as the first word in the ingredient list. Shopping Buy products labeled as "low-sodium" or "no salt added." Buy fresh foods. Avoid canned foods and pre-made or frozen meals. Cooking Avoid adding salt when cooking. Use salt-free seasonings or herbs instead of table salt or sea salt. Check with your health care provider or pharmacist before using salt substitutes. Do not fry foods. Cook foods using healthy methods such as baking, boiling, grilling, roasting, and broiling instead. Cook with heart-healthy oils, such as olive, canola, avocado, soybean, or sunflower oil. Meal planning  Eat a balanced diet that includes: 4 or more servings of fruits and 4 or more servings of vegetables each day. Try to fill one-half of your plate with fruits and vegetables. 6-8 servings of whole grains each day. Less than 6 oz (170 g) of lean meat, poultry, or fish each day. A 3-oz (85-g) serving of meat is about the same size as a deck of cards. One egg equals 1 oz (28 g). 2-3 servings of low-fat dairy each day. One serving is 1 cup (237 mL). 1 serving of nuts, seeds, or beans 5 times each week. 2-3 servings of heart-healthy fats. Healthy fats called omega-3 fatty acids are found in foods such as walnuts, flaxseeds, fortified milks,  and eggs. These fats are also found in cold-water fish, such as sardines, salmon, and mackerel. Limit how much you eat of: Canned or prepackaged foods. Food that is high in trans fat, such as some fried foods. Food that is high in saturated fat, such as fatty meat. Desserts and other sweets, sugary drinks, and other foods with added sugar. Full-fat dairy products. Do not salt foods before eating. Do not eat more than 4 egg yolks a week. Try to eat at least 2 vegetarian meals a week. Eat more home-cooked food and less restaurant, buffet, and fast food. Lifestyle When eating at a restaurant, ask that your food be prepared with less salt or no salt, if possible. If you drink alcohol: Limit how much you use to: 0-1 drink a day for women who are not pregnant. 0-2 drinks a day for men. Be aware of how much alcohol is in your drink. In the [place] one drink equals one 12 oz bottle of beer (355 mL), one 5 oz glass of wine (148 mL), or one 1 oz glass of hard liquor (44 mL). General information Avoid eating more than 2,300 mg of salt a day. If you have hypertension, you may need to reduce your sodium intake to 1,500 mg a day. Work with your health care provider to maintain a healthy body weight or to lose weight. Ask what an ideal weight is for you. Get at least 30 minutes of exercise that causes your heart to beat faster (aerobic exercise) most days of the week. Activities  may include walking, swimming, or biking. Work with your health care provider or dietitian to adjust your eating plan to your individual calorie needs. What foods should I eat? Fruits All fresh, dried, or frozen fruit. Canned fruit in natural juice (without added sugar). Vegetables Fresh or frozen vegetables (raw, steamed, roasted, or grilled). Low-sodium or reduced-sodium tomato and vegetable juice. Low-sodium or reduced-sodium tomato sauce and tomato paste. Low-sodium or reduced-sodium canned vegetables. Grains Whole-grain or  whole-wheat bread. Whole-grain or whole-wheat pasta. Brown rice. [place]. Bulgur. Whole-grain and low-sodium cereals. Pita bread. Low-fat, low-sodium crackers. Whole-wheat flour tortillas. Meats and other proteins Skinless chicken or [place]. Ground chicken or [place]. Pork with fat trimmed off. Fish and seafood. Egg whites. Dried beans, peas, or lentils. Unsalted nuts, nut butters, and seeds. Unsalted canned beans. Lean cuts of beef with fat trimmed off. Low-sodium, lean precooked or cured meat, such as sausages or meat loaves. Dairy Low-fat (1%) or fat-free (skim) milk. Reduced-fat, low-fat, or fat-free cheeses. Nonfat, low-sodium ricotta or cottage cheese. Low-fat or nonfat yogurt. Low-fat, low-sodium cheese. Fats and oils Soft margarine without trans fats. Vegetable oil. Reduced-fat, low-fat, or light mayonnaise and salad dressings (reduced-sodium). Canola, safflower, olive, avocado, soybean, and sunflower oils. Avocado. Seasonings and condiments Herbs. Spices. Seasoning mixes without salt. Other foods Unsalted popcorn and pretzels. Fat-free sweets. The items listed above may not be a complete list of foods and beverages you can eat. Contact a dietitian for more information. What foods should I avoid? Fruits Canned fruit in a light or heavy syrup. Fried fruit. Fruit in cream or butter sauce. Vegetables Creamed or fried vegetables. Vegetables in a cheese sauce. Regular canned vegetables (not low-sodium or reduced-sodium). Regular canned tomato sauce and paste (not low-sodium or reduced-sodium). Regular tomato and vegetable juice (not low-sodium or reduced-sodium). Angie Fava. Olives. Grains Baked goods made with fat, such as croissants, muffins, or some breads. Dry pasta or rice meal packs. Meats and other proteins Fatty cuts of meat. Ribs. Fried meat. Berniece Salines. Bologna, salami, and other precooked or cured meats, such as sausages or meat loaves. Fat from the back of a pig (fatback).  Bratwurst. Salted nuts and seeds. Canned beans with added salt. Canned or smoked fish. Whole eggs or egg yolks. Chicken or [place] with skin. Dairy Whole or 2% milk, cream, and half-and-half. Whole or full-fat cream cheese. Whole-fat or sweetened yogurt. Full-fat cheese. Nondairy creamers. Whipped toppings. Processed cheese and cheese spreads. Fats and oils Butter. Stick margarine. Lard. Shortening. Ghee. Bacon fat. Tropical oils, such as coconut, palm kernel, or palm oil. Seasonings and condiments Onion salt, garlic salt, seasoned salt, table salt, and sea salt. Worcestershire sauce. Tartar sauce. Barbecue sauce. Teriyaki sauce. Soy sauce, including reduced-sodium. Steak sauce. Canned and packaged gravies. Fish sauce. Oyster sauce. Cocktail sauce. Store-bought horseradish. Ketchup. Mustard. Meat flavorings and tenderizers. Bouillon cubes. Hot sauces. Pre-made or packaged marinades. Pre-made or packaged taco seasonings. Relishes. Regular salad dressings. Other foods Salted popcorn and pretzels. The items listed above may not be a complete list of foods and beverages you should avoid. Contact a dietitian for more information. Where to find more information National Heart, Lung, and Blood Institute: https://wilson-eaton.com/ American Heart Association: www.heart.org Academy of Nutrition and Dietetics: www.eatright.[place]: www.kidney.org Summary The DASH eating plan is a healthy eating plan that has been shown to reduce high blood pressure (hypertension). It may also reduce your risk for type 2 diabetes, heart disease, and stroke. When on the DASH eating plan, aim to eat more fresh  fruits and vegetables, whole grains, lean proteins, low-fat dairy, and heart-healthy fats. With the DASH eating plan, you should limit salt (sodium) intake to 2,300 mg a day. If you have hypertension, you may need to reduce your sodium intake to 1,500 mg a day. Work with your health care provider or  dietitian to adjust your eating plan to your individual calorie needs. This information is not intended to replace advice given to you by your health care provider. Make sure you discuss any questions you have with your health care provider. Document Revised: 02/07/2019 Document Reviewed: 02/07/2019 Elsevier Patient Education  [place] Offices:   [place] [address] [place], [place] [phone]  [facility] Address: [address] [place], [place] [address] Phone: [phone]  [place] [address] [place], [place] [address] Phone: [phone]  [place], [place] [address] Phone: [phone]

## 2022-03-22 NOTE — Progress Notes (Signed)
New Patient Office Visit  Subjective    Patient ID: Lydia Lopez, female    DOB: 06-24-1965  Age: 57 y.o. MRN: [id number]  CC:  Chief Complaint  Patient presents with   Establish Care    Still recovering from bell's palsy but thinks she has high BP    HPI Lydia Lopez presents to establish care  No PCP in many years.   Elevated BP at ED visits over the past year.  Has not checked BP at home or work.   Family hx of HTN  Atherosclerosis of vertebral arteries.   Hot flashes x 2 years. Trouble sleeping.   Denies fever, chills, dizziness, chest pain, palpitations, shortness of breath, abdominal pain, N/V/D, urinary symptoms, LE edema.    She is not in favor of mammogram, pap smear or colonoscopy at this time. States she prefers to not know if something is wrong and she would not treat cancer if she had it.   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    Single. No kids. Has a pug.    Outpatient Encounter Medications as of 03/22/2022  Medication Sig   Melatonin 5 MG CHEW Chew by mouth.   Multiple Vitamin (MULTIVITAMIN ADULT PO) Take by mouth.   VITAMIN D, CHOLECALCIFEROL, PO Take by mouth.   [DISCONTINUED] doxycycline (VIBRAMYCIN) 100 MG capsule Take 1 capsule (100 mg total) by mouth 2 (two) times daily.   [DISCONTINUED] predniSONE (DELTASONE) 20 MG tablet Take 60 mg daily x 2 days then 40 mg daily x 2 days then 20 mg daily x 2 days   [DISCONTINUED] valACYclovir (VALTREX) 1000 MG tablet Take 1 tablet (1,000 mg total) by mouth 3 (three) times daily.   No facility-administered encounter medications on file as of 03/22/2022.    Past Medical History:  Diagnosis Date   Bell's palsy     Past Surgical History:  Procedure Laterality Date   NOSE SURGERY      History reviewed. No pertinent family history.  Social History   Socioeconomic History   Marital status: Widowed    Spouse name: Not on file   Number of children: Not on file   Years of education: Not on file    Highest education level: Not on file  Occupational History   Not on file  Tobacco Use   Smoking status: Never   Smokeless tobacco: Never   Tobacco comments:    Second hand smoke exposure in the past   Substance and Sexual Activity   Alcohol use: Yes    Comment: social   Drug use: Never   Sexual activity: Not on file  Other Topics Concern   Not on file  Social History Narrative   Not on file   Social Determinants of Health   Financial Resource Strain: Not on file  Food Insecurity: Not on file  Transportation Needs: Not on file  Physical Activity: Not on file  Stress: Not on file  Social Connections: Not on file  Intimate Partner Violence: Not on file    ROS      Objective    BP 132/88 (BP Location: Left Arm, Patient Position: Sitting, Cuff Size: Large)   Pulse 95   Temp 97.6 F (36.4 C) (Temporal)   Ht '5\' 4"'$  (1.626 m)   Wt 186 lb (84.4 kg)   SpO2 98%   BMI 31.93 kg/m   Physical Exam Constitutional:      General: She is not in acute distress.    Appearance: She  is not ill-appearing.  Cardiovascular:     Rate and Rhythm: Normal rate and regular rhythm.  Pulmonary:     Effort: Pulmonary effort is normal.     Breath sounds: Normal breath sounds.  Musculoskeletal:     Right lower leg: No edema.     Left lower leg: No edema.  Skin:    General: Skin is warm and dry.  Neurological:     General: No focal deficit present.     Mental Status: She is alert and oriented to person, place, and time.  Psychiatric:        Mood and Affect: Mood normal.        Behavior: Behavior normal.        Thought Content: Thought content normal.         Assessment & Plan:   Problem List Items Addressed This Visit       Cardiovascular and Mediastinum   Atherosclerosis   Relevant Orders   CT CARDIAC SCORING (SELF PAY ONLY)     Other   Elevated blood-pressure reading without diagnosis of hypertension - Primary   Relevant Orders   CT CARDIAC SCORING (SELF PAY ONLY)    Screening for ischemic heart disease   Relevant Orders   CT CARDIAC SCORING (SELF PAY ONLY)   Other Visit Diagnoses     Encounter to establish care       Bell's palsy          She is a pleasant 57 year old female who is new to the practice and here to establish care.  Recent visits with elevated blood pressure.  Discussed checking her blood pressure over the next 4 weeks and following up.  DASH diet handout provided.  Blood pressure did improve from the beginning of the visit until just before discharge. She is concerned due to recent atherosclerosis finding on CT of her head for Bell's palsy diagnosis in the ED.  She is interested in self-pay CT cardiac calcium test.  I will order this. Discussed follow-up in 4 weeks and we will perform a CPE at that time.  At this time she declines cancer screening.  We will discuss this further at her follow-up.  Return in about 4 weeks (around 04/19/2022) for fasting CPE and BP f/u.   Harland Dingwall, NP-C

## 2022-04-20 ENCOUNTER — Encounter: Payer: Commercial Managed Care - PPO | Admitting: Family Medicine

## 2022-04-27 ENCOUNTER — Ambulatory Visit: Payer: Commercial Managed Care - PPO | Admitting: Family Medicine

## 2022-05-02 ENCOUNTER — Telehealth: Payer: Self-pay | Admitting: Family Medicine

## 2022-05-02 NOTE — Telephone Encounter (Signed)
Please advise if you think pt should have boil lanced after antibiotic treatment?

## 2022-05-02 NOTE — Telephone Encounter (Signed)
Patient called has boil on breast and was prescribed antibiotics for it. She came in for treatment on 04/27/22. She would like to know if Vickie would recommend her getting the boil lanced since her antibiotics will be completed on 05/04/22. Best callback number is [phone].

## 2022-05-02 NOTE — Telephone Encounter (Signed)
Pt reports it seems to be improving but still is red and swollen with the white dot and she runs out of antibiotics on Thursday. I let her know you will evaluate on upcoming appt on 2/21 and pt just wants to make sure and clarify if it will be ok to be without antibiotics after Thursday (and just wait for appt)?

## 2022-05-02 NOTE — Telephone Encounter (Signed)
(  She was seen by telehealth dr)

## 2022-05-03 ENCOUNTER — Ambulatory Visit
Admission: RE | Admit: 2022-05-03 | Discharge: 2022-05-03 | Disposition: A | Discharge status: Home / Self Care | Payer: Commercial Managed Care - PPO | Source: Ambulatory Visit | Attending: Physician Assistant | Admitting: Physician Assistant

## 2022-05-03 VITALS — BP 149/82 | HR 84 | Temp 98.7°F | Resp 18

## 2022-05-03 DIAGNOSIS — N611 Abscess of the breast and nipple: Secondary | ICD-10-CM | POA: Insufficient documentation

## 2022-05-03 DIAGNOSIS — N644 Mastodynia: Secondary | ICD-10-CM | POA: Insufficient documentation

## 2022-05-03 MED ORDER — SULFAMETHOXAZOLE-TRIMETHOPRIM 800-160 MG PO TABS: 1.0000 | ORAL_TABLET | Freq: Two times a day (BID) | ORAL | 0 refills | Status: AC

## 2022-05-03 NOTE — ED Triage Notes (Signed)
Pt c/o abscess to rt breast x2wks. States has been on doxycycline for 8days. Now having increase redness and tenderness. Denies drainage.

## 2022-05-03 NOTE — Telephone Encounter (Signed)
Called pt and informed she will need to complete medication and wait for upcoming visit to determine the next course of treatment. Pt verbalized understanding.

## 2022-05-03 NOTE — Discharge Instructions (Signed)
Advised to use warm compresses frequently throughout the morning and evening to help with the breast abscess drain. Advised take the Bactrim DS every 12 hours on a regular basis till completed. Advised take Motrin or Advil for pain and discomfort.  Advised follow-up PCP or return to urgent care if symptoms fail to improve.

## 2022-05-03 NOTE — ED Provider Notes (Signed)
[facility]    CSN: [id number] Arrival date & time: 05/03/22  1240      History   Chief Complaint Chief Complaint  Patient presents with   Abscess    Right breast - I believe it needs to be drained in order to heal - can't see my doctor until next Wednesday the 21st. I didn't want to wait that long. - Entered by patient    HPI Lydia Lopez is a 57 y.o. female.   57 year old female presents with right breast abscess.  Patient indicates for the past 2 weeks she has been having a right breast abscess that has become red, tender, and swollen.  Patient indicates that she contacted her PCP who did a televisit and gave her a prescription for doxycycline 100 mg every 12 hours which she has been taking for the past week.  Patient comes in the office today indicating that the breast is still tender and that it is soft in the middle where the cyst is located.  She is concerned that it may need to be drained.  She not have fever, chills, and the abscess has not been draining.   Abscess   Past Medical History:  Diagnosis Date   Bell's palsy     Patient Active Problem List   Diagnosis Date Noted   Atherosclerosis 03/22/2022   Elevated blood-pressure reading without diagnosis of hypertension 03/22/2022   Screening for ischemic heart disease 03/22/2022    Past Surgical History:  Procedure Laterality Date   NOSE SURGERY      OB History   No obstetric history on file.      Home Medications    Prior to Admission medications   Medication Sig Start Date End Date Taking? Authorizing Provider  sulfamethoxazole-trimethoprim (BACTRIM DS) 800-160 MG tablet Take 1 tablet by mouth 2 (two) times daily for 7 days. 05/03/22 05/10/22 Yes Nyoka Lint, PA-C  Melatonin 5 MG CHEW Chew by mouth.    [provider]  Multiple Vitamin (MULTIVITAMIN ADULT PO) Take by mouth.    [provider]  VITAMIN D, CHOLECALCIFEROL, PO Take by mouth.    [provider]     Family History History reviewed. No pertinent family history.  Social History Social History   Tobacco Use   Smoking status: Never   Smokeless tobacco: Never   Tobacco comments:    Second hand smoke exposure in the past   Substance Use Topics   Alcohol use: Yes    Comment: social   Drug use: Never     Allergies   Penicillins   Review of Systems Review of Systems  Skin:  Positive for wound (right breast abscess).     Physical Exam Triage Vital Signs ED Triage Vitals  Enc Vitals Group     BP 05/03/22 1312 (!) 149/82     Pulse Rate 05/03/22 1312 84     Resp 05/03/22 1312 18     Temp 05/03/22 1312 98.7 F (37.1 C)     Temp Source 05/03/22 1312 Oral     SpO2 05/03/22 1312 96 %     Weight --      Height --      Head Circumference --      Peak Flow --      Pain Score 05/03/22 1313 0     Pain Loc --      Pain Edu? --      Excl. in [place]? --    No data  found.  Updated Vital Signs BP (!) 149/82 (BP Location: Left Arm)   Pulse 84   Temp 98.7 F (37.1 C) (Oral)   Resp 18   LMP 12/07/2013   SpO2 96%   Visual Acuity Right Eye Distance:   Left Eye Distance:   Bilateral Distance:    Right Eye Near:   Left Eye Near:    Bilateral Near:     Physical Exam Constitutional:      Appearance: Normal appearance.  Skin:         Comments: Right breast: There is a 3 x 3 cm abscess that is in the right upper outer quadrant of the breast that is 2 inches above the areola.  This area is red, 1 x 1 cm area of increased fluctuance is present.  Mild pain on palpation.  Procedure: Area is cleaned with Betadine, anesthetized with 1% lidocaine-1.5 cc used.  The area is incised and purulent material was expressed.  Wound culture is taken.  No complications.  Neurological:     Mental Status: She is alert.      UC Treatments / Results  Labs (all labs ordered are listed, but only abnormal results are displayed) Labs Reviewed  AEROBIC CULTURE W GRAM STAIN (SUPERFICIAL  SPECIMEN)    EKG   Radiology No results found.  Procedures Procedures (including critical care time)  Medications Ordered in UC Medications - No data to display  Initial Impression / Assessment and Plan / UC Course  I have reviewed the triage vital signs and the nursing notes.  Pertinent labs & imaging results that were available during my care of the patient were reviewed by me and considered in my medical decision making (see chart for details).    Plan: The diagnosis will be treated with the following: 1.  Abscess right breast: A.  Bactrim DS every 12 hours until completed. B.  Warm compresses frequently to the area to encourage drainage. C.  Wound culture is pending. 2.  Right breast pain: A.  Ibuprofen as needed for pain relief. 3.  Advised follow-up PCP or return to urgent care as needed.  Final diagnoses:  Abscess of right breast  Breast pain, right     Discharge Instructions      Advised to use warm compresses frequently throughout the morning and evening to help with the breast abscess drain. Advised take the Bactrim DS every 12 hours on a regular basis till completed. Advised take Motrin or Advil for pain and discomfort.  Advised follow-up PCP or return to urgent care if symptoms fail to improve.    ED Prescriptions     Medication Sig Dispense Auth. Provider   sulfamethoxazole-trimethoprim (BACTRIM DS) 800-160 MG tablet Take 1 tablet by mouth 2 (two) times daily for 7 days. 14 tablet Nyoka Lint, PA-C      PDMP not reviewed this encounter.   Nyoka Lint, PA-C 05/03/22 1355

## 2022-05-07 LAB — AEROBIC CULTURE W GRAM STAIN (SUPERFICIAL SPECIMEN): Culture: NORMAL

## 2022-05-10 ENCOUNTER — Encounter: Payer: Commercial Managed Care - PPO | Admitting: Family Medicine

## 2022-05-24 ENCOUNTER — Encounter: Payer: Self-pay | Admitting: Family Medicine

## 2022-05-24 ENCOUNTER — Other Ambulatory Visit: Payer: Self-pay | Admitting: Family Medicine

## 2022-05-24 ENCOUNTER — Ambulatory Visit (INDEPENDENT_AMBULATORY_CARE_PROVIDER_SITE_OTHER): Payer: Commercial Managed Care - PPO | Admitting: Family Medicine

## 2022-05-24 ENCOUNTER — Ambulatory Visit (INDEPENDENT_AMBULATORY_CARE_PROVIDER_SITE_OTHER): Payer: Commercial Managed Care - PPO

## 2022-05-24 VITALS — BP 136/94 | HR 95 | Temp 97.7°F | Ht 64.0 in | Wt 185.0 lb

## 2022-05-24 DIAGNOSIS — R0602 Shortness of breath: Secondary | ICD-10-CM | POA: Diagnosis not present

## 2022-05-24 DIAGNOSIS — Z0001 Encounter for general adult medical examination with abnormal findings: Secondary | ICD-10-CM | POA: Diagnosis not present

## 2022-05-24 DIAGNOSIS — R7309 Other abnormal glucose: Secondary | ICD-10-CM

## 2022-05-24 DIAGNOSIS — R002 Palpitations: Secondary | ICD-10-CM

## 2022-05-24 DIAGNOSIS — R079 Chest pain, unspecified: Secondary | ICD-10-CM

## 2022-05-24 DIAGNOSIS — R030 Elevated blood-pressure reading, without diagnosis of hypertension: Secondary | ICD-10-CM | POA: Diagnosis not present

## 2022-05-24 DIAGNOSIS — Z1159 Encounter for screening for other viral diseases: Secondary | ICD-10-CM | POA: Diagnosis not present

## 2022-05-24 DIAGNOSIS — Z1211 Encounter for screening for malignant neoplasm of colon: Secondary | ICD-10-CM | POA: Diagnosis not present

## 2022-05-24 DIAGNOSIS — N611 Abscess of the breast and nipple: Secondary | ICD-10-CM

## 2022-05-24 DIAGNOSIS — I7090 Unspecified atherosclerosis: Secondary | ICD-10-CM

## 2022-05-24 DIAGNOSIS — D225 Melanocytic nevi of trunk: Secondary | ICD-10-CM | POA: Diagnosis not present

## 2022-05-24 LAB — CBC WITH DIFFERENTIAL/PLATELET
Basophils Absolute: 0.1 10*3/uL (ref 0.0–0.1)
Basophils Relative: 0.8 % (ref 0.0–3.0)
Eosinophils Absolute: 0.3 10*3/uL (ref 0.0–0.7)
Eosinophils Relative: 3.9 % (ref 0.0–5.0)
HCT: 42.1 % (ref 36.0–46.0)
Hemoglobin: 14.1 g/dL (ref 12.0–15.0)
Lymphocytes Relative: 34.2 % (ref 12.0–46.0)
Lymphs Abs: 2.6 10*3/uL (ref 0.7–4.0)
MCHC: 33.4 g/dL (ref 30.0–36.0)
MCV: 86.3 fl (ref 78.0–100.0)
Monocytes Absolute: 0.8 10*3/uL (ref 0.1–1.0)
Monocytes Relative: 10 % (ref 3.0–12.0)
Neutro Abs: 3.8 10*3/uL (ref 1.4–7.7)
Neutrophils Relative %: 51.1 % (ref 43.0–77.0)
Platelets: 343 10*3/uL (ref 150.0–400.0)
RBC: 4.88 Mil/uL (ref 3.87–5.11)
RDW: 14.3 % (ref 11.5–15.5)
WBC: 7.5 10*3/uL (ref 4.0–10.5)

## 2022-05-24 LAB — COMPREHENSIVE METABOLIC PANEL
ALT: 23 U/L (ref 0–35)
AST: 24 U/L (ref 0–37)
Albumin: 4.3 g/dL (ref 3.5–5.2)
Alkaline Phosphatase: 134 U/L — ABNORMAL HIGH (ref 39–117)
BUN: 22 mg/dL (ref 6–23)
CO2: 22 mEq/L (ref 19–32)
Calcium: 10.2 mg/dL (ref 8.4–10.5)
Chloride: 104 mEq/L (ref 96–112)
Creatinine, Ser: 0.87 mg/dL (ref 0.40–1.20)
GFR: 74.99 mL/min (ref >60.00)
Glucose, Bld: 87 mg/dL (ref 70–99)
Potassium: 4 mEq/L (ref 3.5–5.1)
Sodium: 137 mEq/L (ref 135–145)
Total Bilirubin: 0.5 mg/dL (ref 0.2–1.2)
Total Protein: 8.4 g/dL — ABNORMAL HIGH (ref 6.0–8.3)

## 2022-05-24 LAB — LIPID PANEL
Cholesterol: 199 mg/dL (ref 0–200)
HDL: 56.1 mg/dL (ref >39.00)
LDL Cholesterol: 104 mg/dL — ABNORMAL HIGH (ref 0–99)
NonHDL: 143.27
Total CHOL/HDL Ratio: 4
Triglycerides: 196 mg/dL — ABNORMAL HIGH (ref 0.0–149.0)
VLDL: 39.2 mg/dL (ref 0.0–40.0)

## 2022-05-24 LAB — HEMOGLOBIN A1C: Hgb A1c MFr Bld: 5.8 % (ref 4.6–6.5)

## 2022-05-24 LAB — T4, FREE: Free T4: 0.5 ng/dL — ABNORMAL LOW (ref 0.60–1.60)

## 2022-05-24 LAB — TSH: TSH: 1.38 u[IU]/mL (ref 0.35–5.50)

## 2022-05-24 LAB — TROPONIN I (HIGH SENSITIVITY): High Sens Troponin I: 4 ng/L (ref 2–17)

## 2022-05-24 NOTE — Patient Instructions (Addendum)
Please go downstairs for labs and a chest x-ray before you leave.  I placed an urgent referral to the breast center for an ultrasound of your right breast. If you develop fever, chills or worsening pain, redness or swelling, let me know.  I also placed a referral to cardiology.  They will call you to schedule a visit.  I will be in touch with results and recommendations.  Continue to monitor your blood pressure at home.  I ordered a Cologuard test which will be mailed to your home.  This is for colon cancer screening.   Please return for a Pap smear at your convenience or see an OB/GYN for cervical cancer screening.  Preventive Care 61-24 Years Old, Female Preventive care refers to lifestyle choices and visits with your health care provider that can promote health and wellness. Preventive care visits are also called wellness exams. What can I expect for my preventive care visit? Counseling Your health care provider may ask you questions about your: Medical history, including: Past medical problems. Family medical history. Pregnancy history. Current health, including: Menstrual cycle. Method of birth control. Emotional well-being. Home life and relationship well-being. Sexual activity and sexual health. Lifestyle, including: Alcohol, nicotine or tobacco, and drug use. Access to firearms. Diet, exercise, and sleep habits. Work and work Statistician. Sunscreen use. Safety issues such as seatbelt and bike helmet use. Physical exam Your health care provider will check your: Height and weight. These may be used to calculate your BMI (body mass index). BMI is a measurement that tells if you are at a healthy weight. Waist circumference. This measures the distance around your waistline. This measurement also tells if you are at a healthy weight and may help predict your risk of certain diseases, such as type 2 diabetes and high blood pressure. Heart rate and blood pressure. Body  temperature. Skin for abnormal spots. What immunizations do I need?  Vaccines are usually given at various ages, according to a schedule. Your health care provider will recommend vaccines for you based on your age, medical history, and lifestyle or other factors, such as travel or where you work. What tests do I need? Screening Your health care provider may recommend screening tests for certain conditions. This may include: Lipid and cholesterol levels. Diabetes screening. This is done by checking your blood sugar (glucose) after you have not eaten for a while (fasting). Pelvic exam and Pap test. Hepatitis B test. Hepatitis C test. HIV (human immunodeficiency virus) test. STI (sexually transmitted infection) testing, if you are at risk. Lung cancer screening. Colorectal cancer screening. Mammogram. Talk with your health care provider about when you should start having regular mammograms. This may depend on whether you have a family history of breast cancer. BRCA-related cancer screening. This may be done if you have a family history of breast, ovarian, tubal, or peritoneal cancers. Bone density scan. This is done to screen for osteoporosis. Talk with your health care provider about your test results, treatment options, and if necessary, the need for more tests. Follow these instructions at home: Eating and drinking  Eat a diet that includes fresh fruits and vegetables, whole grains, lean protein, and low-fat dairy products. Take vitamin and mineral supplements as recommended by your health care provider. Do not drink alcohol if: Your health care provider tells you not to drink. You are pregnant, may be pregnant, or are planning to become pregnant. If you drink alcohol: Limit how much you have to 0-1 drink a day. Know how  much alcohol is in your drink. In the [place] one drink equals one 12 oz bottle of beer (355 mL), one 5 oz glass of wine (148 mL), or one 1 oz glass of hard liquor (44  mL). Lifestyle Brush your teeth every morning and night with fluoride toothpaste. Floss one time each day. Exercise for at least 30 minutes 5 or more days each week. Do not use any products that contain nicotine or tobacco. These products include cigarettes, chewing tobacco, and vaping devices, such as e-cigarettes. If you need help quitting, ask your health care provider. Do not use drugs. If you are sexually active, practice safe sex. Use a condom or other form of protection to prevent STIs. If you do not wish to become pregnant, use a form of birth control. If you plan to become pregnant, see your health care provider for a prepregnancy visit. Take aspirin only as told by your health care provider. Make sure that you understand how much to take and what form to take. Work with your health care provider to find out whether it is safe and beneficial for you to take aspirin daily. Find healthy ways to manage stress, such as: Meditation, yoga, or listening to music. Journaling. Talking to a trusted person. Spending time with friends and family. Minimize exposure to UV radiation to reduce your risk of skin cancer. Safety Always wear your seat belt while driving or riding in a vehicle. Do not drive: If you have been drinking alcohol. Do not ride with someone who has been drinking. When you are tired or distracted. While texting. If you have been using any mind-altering substances or drugs. Wear a helmet and other protective equipment during sports activities. If you have firearms in your house, make sure you follow all gun safety procedures. Seek help if you have been physically or sexually abused. What's next? Visit your health care provider once a year for an annual wellness visit. Ask your health care provider how often you should have your eyes and teeth checked. Stay up to date on all vaccines. This information is not intended to replace advice given to you by your health care  provider. Make sure you discuss any questions you have with your health care provider. Document Revised: 09/01/2020 Document Reviewed: 09/01/2020 Elsevier Patient Education  [employer].

## 2022-05-24 NOTE — Progress Notes (Signed)
Subjective:    Patient ID: Lydia Lopez, female    DOB: 1965/06/05, 57 y.o.   MRN: [id number]  HPI Chief Complaint  Patient presents with   Annual Exam    Wants abscess that was drained on right breast looked at to make sure healing correctly    She is fairly new to the practice and here for a complete physical exam.  States she had chest pain 4 days ago. States she had chest pain, upper back pain and jaw pain when she was stressed at work. Symptoms resolved after stress improved.    Abscess of right breast 3 weeks ago. Drained in urgent care. Completed antibiotics Doxycycline and Bactrim DS. Having TTP and erythema.  Denies fever, chills. Does not feel sick.   Recent visits with elevated blood pressure.  BP 130s-140s/80s-90s.   Propranolol for palpitations and anxiety in the past.    She is concerned due to recent atherosclerosis finding on CT of her head for Bell's palsy diagnosis in the ED.  She is interested in self-pay CT cardiac calcium test.  this has been ordered.    Social history: single works as Training and development officer maintenance:   Mammogram: never  Colonoscopy: never  Last Gynecological Exam: 30 years  Last Dental Exam: 3 years ago Last Eye Exam: overdue   Wears seatbelt always, uses sunscreen, smoke detectors in home and functioning, does not text while driving and feels safe in home environment.   Reviewed allergies, medications, past medical, surgical, family, and social history.    Review of Systems Review of Systems Constitutional: -fever, -chills, -sweats, -unexpected weight change,-fatigue ENT: -runny nose, -ear pain, -sore throat Cardiology:  -chest pain, -palpitations, -edema Respiratory: -cough, -shortness of breath, -wheezing Gastroenterology: -abdominal pain, -nausea, -vomiting, -diarrhea, -constipation  Hematology: -bleeding or bruising problems Musculoskeletal: -arthralgias, -myalgias, -joint swelling, -back  pain Ophthalmology: -vision changes Urology: -dysuria, -difficulty urinating, -hematuria, -urinary frequency, -urgency Neurology: -headache, -weakness, -tingling, -numbness       Objective:   Physical Exam BP (!) 136/94   Pulse 95   Temp 97.7 F (36.5 C) (Temporal)   Ht '5\' 4"'$  (1.626 m)   Wt 185 lb (83.9 kg)   LMP 12/07/2013   SpO2 97%   BMI 31.76 kg/m   General Appearance:    Alert, cooperative, no distress, appears stated age  Head:    Normocephalic, without obvious abnormality, atraumatic  Eyes:    PERRL, conjunctiva/corneas clear, EOM's intact, fundi    benign  Ears:    Normal TM's and external ear canals  Nose:   Nares normal, mucosa normal, no drainage or sinus   tenderness  Throat:   Lips, mucosa, and tongue normal; teeth and gums normal  Neck:   Supple, no lymphadenopathy;  thyroid:  no   enlargement/tenderness/nodules; no carotid   bruit or JVD  Back:    Spine nontender, no curvature, ROM normal, no CVA     tenderness  Lungs:     Clear to auscultation bilaterally without wheezes, rales or     ronchi; respirations unlabored  Chest Wall:    No tenderness or deformity   Heart:    Regular rate and rhythm  Breast Exam:    Tenderness of right breast at area of recent I & D for abscess. Declines breast screening otherwise.     Abdomen:     Soft, non-tender, nondistended, normoactive bowel sounds,    no masses, no hepatosplenomegaly  Genitalia:  Not done     Extremities:   No clubbing, cyanosis or edema  Pulses:   2+ and symmetric all extremities  Skin:   Skin color, texture, turgor normal, left axilla with abnormal nevus  Lymph nodes:   Cervical, supraclavicular, and axillary nodes normal  Neurologic:   CNII-XII intact, normal strength, sensation and gait; reflexes 2+ and symmetric throughout          Psych:   Normal mood, affect, hygiene and grooming.         Assessment & Plan:  Encounter for general adult medical examination with abnormal findings Preventive  health care reviewed and counseling done.  She has not been amenable to colon cancer screening, mammograms or Pap smears.  Cologuard ordered.  Encouraged cancer screening.  Counseling on healthy lifestyle including diet and exercise.  Immunizations reviewed.   Atherosclerosis - Plan: Lipid panel, Ambulatory referral to Cardiology, Lipid panel -Check lipid panel and start on statin therapy  Need for hepatitis C screening test - Plan: Hepatitis C Antibody, Hepatitis C Antibody -Done per screening guidelines  Abscess of breast - Plan: [place] LIMITED ULTRASOUND INCLUDING AXILLA RIGHT BREAST, CANCELED: US BREAST COMPLETE UNI RIGHT INC AXILLA -No sign of acute infection today.  Recent I&D of the area.  Completed antibiotics.  Referral for ultrasound  Irritated nevus of axilla - Plan: Ambulatory referral to Dermatology -Referral to dermatology for further evaluation  Palpitations - Plan: Comprehensive metabolic panel, CBC w/Diff, TSH, T4, free, Troponin I (High Sensitivity), D-dimer, quantitative, EKG 12-Lead, DG Chest 2 View, Ambulatory referral to Cardiology, D-dimer, quantitative, Troponin I (High Sensitivity), T4, free, TSH, CBC w/Diff, Comprehensive metabolic panel -Check labs including thyroid function.  Referral to cardiology for further evaluation.  Chest pain, unspecified type - Plan: Comprehensive metabolic panel, CBC w/Diff, Troponin I (High Sensitivity), D-dimer, quantitative, EKG 12-Lead, DG Chest 2 View, Ambulatory referral to Cardiology, D-dimer, quantitative, Troponin I (High Sensitivity), CBC w/Diff, Comprehensive metabolic panel -No active chest pain.  EKG does not show acute MI.  Check labs including D-dimer, troponin and refer to cardiology. EKG shows NSR, rate 94. No Q waves or acute ST- T wave changes.  Elevated blood-pressure reading without diagnosis of hypertension - Plan: Comprehensive metabolic panel, CBC w/Diff, Ambulatory referral to Cardiology, CBC w/Diff, Comprehensive  metabolic panel -Reportedly has taken propranolol in the past.  Monitor blood pressure outside of here.  Referral to cardiology  Elevated random blood glucose level - Plan: Hemoglobin A1c, Hemoglobin A1c  Colon cancer screening - Plan: Cologuard -This will be her first colon cancer screening test

## 2022-05-25 LAB — HEPATITIS C ANTIBODY: Hepatitis C Ab: NONREACTIVE

## 2022-05-25 LAB — D-DIMER, QUANTITATIVE: D-Dimer, Quant: 0.28 mcg/mL FEU (ref <0.50)

## 2022-05-28 ENCOUNTER — Other Ambulatory Visit: Payer: Self-pay | Admitting: Family Medicine

## 2022-05-28 DIAGNOSIS — I7090 Unspecified atherosclerosis: Secondary | ICD-10-CM

## 2022-05-28 DIAGNOSIS — E785 Hyperlipidemia, unspecified: Secondary | ICD-10-CM

## 2022-05-28 MED ORDER — ATORVASTATIN CALCIUM 10 MG PO TABS: 10.0000 mg | ORAL_TABLET | Freq: Every day | ORAL | 3 refills | Status: DC

## 2022-05-30 ENCOUNTER — Ambulatory Visit
Admission: RE | Admit: 2022-05-30 | Discharge: 2022-05-30 | Disposition: A | Discharge status: Home / Self Care | Payer: Commercial Managed Care - PPO | Source: Ambulatory Visit | Attending: Family Medicine | Admitting: Family Medicine

## 2022-05-30 DIAGNOSIS — N611 Abscess of the breast and nipple: Secondary | ICD-10-CM

## 2022-05-30 DIAGNOSIS — N6489 Other specified disorders of breast: Secondary | ICD-10-CM | POA: Diagnosis not present

## 2022-05-30 DIAGNOSIS — R922 Inconclusive mammogram: Secondary | ICD-10-CM | POA: Diagnosis not present

## 2022-06-13 ENCOUNTER — Ambulatory Visit: Payer: Commercial Managed Care - PPO | Attending: Internal Medicine | Admitting: Internal Medicine

## 2022-06-13 ENCOUNTER — Encounter: Payer: Self-pay | Admitting: Internal Medicine

## 2022-06-13 VITALS — BP 160/110 | HR 85 | Ht 64.0 in | Wt 184.2 lb

## 2022-06-13 DIAGNOSIS — I10 Essential (primary) hypertension: Secondary | ICD-10-CM | POA: Diagnosis not present

## 2022-06-13 DIAGNOSIS — Z79899 Other long term (current) drug therapy: Secondary | ICD-10-CM | POA: Diagnosis not present

## 2022-06-13 MED ORDER — PROPRANOLOL HCL 20 MG PO TABS: 20.0000 mg | ORAL_TABLET | Freq: Two times a day (BID) | ORAL | 1 refills | Status: DC

## 2022-06-13 MED ORDER — OLMESARTAN MEDOXOMIL 20 MG PO TABS: 20.0000 mg | ORAL_TABLET | Freq: Every day | ORAL | 3 refills | Status: DC

## 2022-06-13 NOTE — Progress Notes (Signed)
Cardiology Office Note:    Date:  06/13/2022   ID:  Lydia Lopez, DOB [birth year], MRN [id number]  PCP:  Lydia Lopez   [facility] Providers Cardiologist:  Lydia Mayo, MD     Referring MD: Lydia Lopez   No chief complaint on file. Palpitations  History of Present Illness:    [name] is a 58 y.o. female with a hx of Bells palsy, HTN, referral for atherosclerosis was noted in the vertebral arteries and palpitations. TSH was normal. Hgb was normal. K was normal. EKG showed NSR.  Her blood pressure have been high, has not checked it well. Checking 140/90s. No prior cardiac w/u. Denies chest pain, SOB. No headache or significant vision changes. 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  Work in Education officer, environmental health  She was on propanolol in the past 20 mg BID. Notes some palpitations with stress.  Family Hx: mother and grandmother had hypertension.   Social Hx: no smoking hx  The 10-year ASCVD risk score (Arnett DK, et al., 2019) is: 4.1%   Values used to calculate the score:     Age: 63 years     Sex: Female     Is Non-Hispanic African American: No     Diabetic: No     Tobacco smoker: No     Systolic Blood Pressure: 0000000 mmHg     Is BP treated: Yes     HDL Cholesterol: 56.1 mg/dL     Total Cholesterol: 199 mg/dL   Past Medical History:  Diagnosis Date   Bell's palsy    Hypertension 03/03/22    Past Surgical History:  Procedure Laterality Date   COSMETIC SURGERY  35 years ago   NOSE SURGERY      Current Medications: Current Meds  Medication Sig   atorvastatin (LIPITOR) 10 MG tablet Take 1 tablet (10 mg total) by mouth daily.   Melatonin 5 MG CHEW Chew by mouth.   Multiple Vitamin (MULTIVITAMIN ADULT PO) Take by mouth.   olmesartan (BENICAR) 20 MG tablet Take 1 tablet (20 mg total) by mouth daily.   OVER THE COUNTER MEDICATION    propranolol (INDERAL) 20 MG tablet Take 1 tablet (20 mg total) by mouth 2 (two)  times daily.   VITAMIN D, CHOLECALCIFEROL, PO Take by mouth.     Allergies:   Penicillins   Social History   Socioeconomic History   Marital status: Widowed    Spouse name: Not on file   Number of children: Not on file   Years of education: Not on file   Highest education level: Not on file  Occupational History   Not on file  Tobacco Use   Smoking status: Never   Smokeless tobacco: Never   Tobacco comments:    Husband smoked (I'm widowed 20 years)  Substance and Sexual Activity   Alcohol use: Yes    Alcohol/week: 7.0 standard drinks of alcohol    Types: 7 Shots of liquor per week    Comment: social   Drug use: Never   Sexual activity: Not Currently    Birth control/protection: Abstinence  Other Topics Concern   Not on file  Social History Narrative   Not on file   Social Determinants of Health   Financial Resource Strain: Not on file  Food Insecurity: Not on file  Transportation Needs: Not on file  Physical Activity: Not on file  Stress: Not on file  Social  Connections: Not on file     Family History: The patient's family history includes Anxiety disorder in her maternal grandmother; Arthritis in her maternal grandmother; Cancer in her maternal uncle.  ROS:   Please see the history of present illness.     All other systems reviewed and are negative.  EKGs/Labs/Other Studies Reviewed:    The following studies were reviewed today:   EKG:  EKG is  ordered today.  The ekg ordered today demonstrates   06/13/2022- NSR  Recent Labs: 05/24/2022: ALT 23; BUN 22; Creatinine, Ser 0.87; Hemoglobin 14.1; Platelets 343.0; Potassium 4.0; Sodium 137; TSH 1.38   Recent Lipid Panel    Component Value Date/Time   CHOL 199 05/24/2022 1104   TRIG 196.0 (H) 05/24/2022 1104   HDL 56.10 05/24/2022 1104   CHOLHDL 4 05/24/2022 1104   VLDL 39.2 05/24/2022 1104   LDLCALC 104 (H) 05/24/2022 1104     Risk Assessment/Calculations:     Physical Exam:    VS:  Vitals:    06/13/22 1023  BP: (!) 160/110  Pulse: 85  SpO2: 98%      BP (!) 160/110   Pulse 85   Ht 5\' 4"  (1.626 m)   Wt 184 lb 3.2 oz (83.6 kg)   LMP 12/07/2013   SpO2 98%   BMI 31.62 kg/m     Wt Readings from Last 3 Encounters:  06/13/22 184 lb 3.2 oz (83.6 kg)  05/24/22 185 lb (83.9 kg)  03/22/22 186 lb (84.4 kg)     GEN:  Well nourished, well developed in no acute distress HEENT: Normal NECK: No JVD; No carotid bruits LYMPHATICS: No lymphadenopathy CARDIAC: RRR, no murmurs, rubs, gallops RESPIRATORY:  Clear to auscultation without rales, wheezing or rhonchi  ABDOMEN: Soft, non-tender, non-distended MUSCULOSKELETAL:  No edema; No deformity  SKIN: Warm and dry NEUROLOGIC:  Alert and oriented x 3 PSYCHIATRIC:  Normal affect   ASSESSMENT:    HTN: elevated, stress likely contributing. start olmesartan 20 mg daily  Palpitations: start propanolol 20 mg daily  Atherosclerosis: ASCVD 3%, no strong indication CT scoring. 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 Will reassess adverse CVD risk in the future  PLAN:    In order of problems listed above:  Olmesartan 20 mg daily Propanolol 20 mg BID BMET 2 weeks Follow up 6 months      Medication Adjustments/Labs and Tests Ordered: Current medicines are reviewed at length with the patient today.  Concerns regarding medicines are outlined above.  Orders Placed This Encounter  Procedures   Basic Metabolic Panel (BMET)   EKG 12-Lead   Meds ordered this encounter  Medications   olmesartan (BENICAR) 20 MG tablet    Sig: Take 1 tablet (20 mg total) by mouth daily.    Dispense:  90 tablet    Refill:  3   propranolol (INDERAL) 20 MG tablet    Sig: Take 1 tablet (20 mg total) by mouth 2 (two) times daily.    Dispense:  180 tablet    Refill:  1    Patient Instructions  Medication Instructions:  Start Olmesartan 20 MG Once daily.  Start Propanolol 20 MG Twice daily.  *If you need a refill on your cardiac  medications before your next appointment, please call your pharmacy*   Lab Work: Please return for Blood Work in 2 weeks. No appointment needed, lab here at the office is open Monday-Friday from 8AM to 4PM and closed daily for lunch from 12:45-1:45.   If you  have labs (blood work) drawn today and your tests are completely normal, you will receive your results only by: [place] (if you have MyChart) OR A paper copy in the mail If you have any lab test that is abnormal or we need to change your treatment, we will call you to review the results.   Testing/Procedures:    Follow-Up: At [facility], you and your health needs are our priority.  As part of our continuing mission to provide you with exceptional heart care, we have created designated Provider Care Teams.  These Care Teams include your primary Cardiologist (physician) and Advanced Practice Providers (APPs -  Physician Assistants and Nurse Practitioners) who all work together to provide you with the care you need, when you need it.  We recommend signing up for the patient portal called "MyChart".  Sign up information is provided on this After Visit Summary.  MyChart is used to connect with patients for Virtual Visits (Telemedicine).  Patients are able to view lab/test results, encounter notes, upcoming appointments, etc.  Non-urgent messages can be sent to your provider as well.   To learn more about what you can do with MyChart, go to [email or web address].    Your next appointment:   6 month(s)  Provider:   Janina Mayo, MD     Other Instructions    Signed, Lydia Mayo, MD  06/13/2022 4:31 PM    [place]

## 2022-06-13 NOTE — Patient Instructions (Signed)
Medication Instructions:  Start Olmesartan 20 MG Once daily.  Start Propanolol 20 MG Twice daily.  *If you need a refill on your cardiac medications before your next appointment, please call your pharmacy*   Lab Work: Please return for Blood Work in 2 weeks. No appointment needed, lab here at the office is open Monday-Friday from 8AM to 4PM and closed daily for lunch from 12:45-1:45.   If you have labs (blood work) drawn today and your tests are completely normal, you will receive your results only by: [place] (if you have MyChart) OR A paper copy in the mail If you have any lab test that is abnormal or we need to change your treatment, we will call you to review the results.   Testing/Procedures:    Follow-Up: At [facility], you and your health needs are our priority.  As part of our continuing mission to provide you with exceptional heart care, we have created designated Provider Care Teams.  These Care Teams include your primary Cardiologist (physician) and Advanced Practice Providers (APPs -  Physician Assistants and Nurse Practitioners) who all work together to provide you with the care you need, when you need it.  We recommend signing up for the patient portal called "MyChart".  Sign up information is provided on this After Visit Summary.  MyChart is used to connect with patients for Virtual Visits (Telemedicine).  Patients are able to view lab/test results, encounter notes, upcoming appointments, etc.  Non-urgent messages can be sent to your provider as well.   To learn more about what you can do with MyChart, go to [email or web address].    Your next appointment:   6 month(s)  Provider:   Janina Mayo, MD     Other Instructions

## 2022-06-28 DIAGNOSIS — H52223 Regular astigmatism, bilateral: Secondary | ICD-10-CM | POA: Diagnosis not present

## 2022-06-28 DIAGNOSIS — H524 Presbyopia: Secondary | ICD-10-CM | POA: Diagnosis not present

## 2022-07-03 ENCOUNTER — Telehealth: Payer: Self-pay | Admitting: Family Medicine

## 2022-07-03 NOTE — Telephone Encounter (Signed)
Patient tested positive for Covid 07/03/22. She would like to know if PCP is able to prescribe Paxlovid and send it to [id number] - [place], [place] - [address] Best callback is [phone].

## 2022-07-04 ENCOUNTER — Telehealth: Payer: Commercial Managed Care - PPO | Admitting: Emergency Medicine

## 2022-12-10 ENCOUNTER — Other Ambulatory Visit: Payer: Self-pay | Admitting: Internal Medicine

## 2023-06-17 ENCOUNTER — Other Ambulatory Visit: Payer: Self-pay | Admitting: Internal Medicine

## 2023-07-19 ENCOUNTER — Other Ambulatory Visit: Payer: Self-pay | Admitting: Internal Medicine

## 2023-07-19 ENCOUNTER — Other Ambulatory Visit: Payer: Self-pay | Admitting: Family Medicine

## 2023-07-19 DIAGNOSIS — I7090 Unspecified atherosclerosis: Secondary | ICD-10-CM

## 2023-07-19 DIAGNOSIS — E785 Hyperlipidemia, unspecified: Secondary | ICD-10-CM

## 2023-07-21 ENCOUNTER — Other Ambulatory Visit: Payer: Self-pay | Admitting: Internal Medicine

## 2023-08-02 ENCOUNTER — Other Ambulatory Visit: Payer: Self-pay | Admitting: Cardiology

## 2023-08-11 ENCOUNTER — Other Ambulatory Visit: Payer: Self-pay | Admitting: Cardiology

## 2023-08-12 ENCOUNTER — Other Ambulatory Visit: Payer: Self-pay | Admitting: Family Medicine

## 2023-08-12 DIAGNOSIS — I7090 Unspecified atherosclerosis: Secondary | ICD-10-CM

## 2023-08-12 DIAGNOSIS — E785 Hyperlipidemia, unspecified: Secondary | ICD-10-CM

## 2023-08-16 ENCOUNTER — Other Ambulatory Visit: Payer: Self-pay | Admitting: Family Medicine

## 2023-08-16 ENCOUNTER — Other Ambulatory Visit: Payer: Self-pay | Admitting: Internal Medicine

## 2023-08-16 DIAGNOSIS — I7090 Unspecified atherosclerosis: Secondary | ICD-10-CM

## 2023-08-16 DIAGNOSIS — E785 Hyperlipidemia, unspecified: Secondary | ICD-10-CM
